# Patient Record
Sex: Female | Born: 1945 | Race: White | Hispanic: No | Marital: Single
Health system: Southern US, Community
[De-identification: ages and names within clinical notes are randomized; demographics above are authoritative.]

## PROBLEM LIST (undated history)

## (undated) DIAGNOSIS — I10 Essential (primary) hypertension: Secondary | ICD-10-CM

## (undated) DIAGNOSIS — F0390 Unspecified dementia without behavioral disturbance: Secondary | ICD-10-CM

---

## 2020-11-24 ENCOUNTER — Other Ambulatory Visit: Payer: Self-pay

## 2020-11-24 ENCOUNTER — Emergency Department: Payer: Medicare Other

## 2020-11-24 ENCOUNTER — Inpatient Hospital Stay
Admission: EM | Admit: 2020-11-24 | Discharge: 2020-11-29 | DRG: 640 | Disposition: A | Discharge status: Home Health | Payer: Medicare Other | Attending: Internal Medicine | Admitting: Internal Medicine

## 2020-11-24 DIAGNOSIS — I959 Hypotension, unspecified: Secondary | ICD-10-CM | POA: Diagnosis present

## 2020-11-24 DIAGNOSIS — E876 Hypokalemia: Secondary | ICD-10-CM | POA: Diagnosis present

## 2020-11-24 DIAGNOSIS — I493 Ventricular premature depolarization: Secondary | ICD-10-CM | POA: Diagnosis present

## 2020-11-24 DIAGNOSIS — S8011XA Contusion of right lower leg, initial encounter: Secondary | ICD-10-CM | POA: Diagnosis present

## 2020-11-24 DIAGNOSIS — S20229A Contusion of unspecified back wall of thorax, initial encounter: Secondary | ICD-10-CM | POA: Diagnosis present

## 2020-11-24 DIAGNOSIS — S7012XA Contusion of left thigh, initial encounter: Secondary | ICD-10-CM | POA: Diagnosis present

## 2020-11-24 DIAGNOSIS — T441X6A Underdosing of other parasympathomimetics, initial encounter: Secondary | ICD-10-CM | POA: Diagnosis present

## 2020-11-24 DIAGNOSIS — W19XXXA Unspecified fall, initial encounter: Secondary | ICD-10-CM | POA: Diagnosis present

## 2020-11-24 DIAGNOSIS — R269 Unspecified abnormalities of gait and mobility: Secondary | ICD-10-CM | POA: Diagnosis present

## 2020-11-24 DIAGNOSIS — G9341 Metabolic encephalopathy: Secondary | ICD-10-CM | POA: Diagnosis present

## 2020-11-24 DIAGNOSIS — F0281 Dementia in other diseases classified elsewhere with behavioral disturbance: Secondary | ICD-10-CM | POA: Diagnosis present

## 2020-11-24 DIAGNOSIS — R296 Repeated falls: Secondary | ICD-10-CM | POA: Diagnosis present

## 2020-11-24 DIAGNOSIS — Z20822 Contact with and (suspected) exposure to covid-19: Secondary | ICD-10-CM | POA: Diagnosis present

## 2020-11-24 DIAGNOSIS — R4182 Altered mental status, unspecified: Secondary | ICD-10-CM | POA: Diagnosis present

## 2020-11-24 DIAGNOSIS — Y92019 Unspecified place in single-family (private) house as the place of occurrence of the external cause: Secondary | ICD-10-CM

## 2020-11-24 DIAGNOSIS — E860 Dehydration: Secondary | ICD-10-CM | POA: Diagnosis present

## 2020-11-24 DIAGNOSIS — G309 Alzheimer's disease, unspecified: Secondary | ICD-10-CM | POA: Diagnosis present

## 2020-11-24 DIAGNOSIS — R5381 Other malaise: Secondary | ICD-10-CM | POA: Diagnosis present

## 2020-11-24 DIAGNOSIS — F03918 Unspecified dementia, unspecified severity, with other behavioral disturbance: Secondary | ICD-10-CM | POA: Diagnosis present

## 2020-11-24 DIAGNOSIS — S5002XA Contusion of left elbow, initial encounter: Secondary | ICD-10-CM | POA: Diagnosis present

## 2020-11-24 DIAGNOSIS — E871 Hypo-osmolality and hyponatremia: Principal | ICD-10-CM | POA: Diagnosis present

## 2020-11-24 DIAGNOSIS — Z91128 Patient's intentional underdosing of medication regimen for other reason: Secondary | ICD-10-CM

## 2020-11-24 DIAGNOSIS — F0391 Unspecified dementia with behavioral disturbance: Secondary | ICD-10-CM | POA: Diagnosis present

## 2020-11-24 DIAGNOSIS — I10 Essential (primary) hypertension: Secondary | ICD-10-CM | POA: Diagnosis present

## 2020-11-24 DIAGNOSIS — N3000 Acute cystitis without hematuria: Secondary | ICD-10-CM

## 2020-11-24 DIAGNOSIS — M25552 Pain in left hip: Secondary | ICD-10-CM

## 2020-11-24 LAB — RESP PANEL BY RT-PCR (FLU A&B, COVID) ARPGX2
Influenza A by PCR: NEGATIVE
Influenza B by PCR: NEGATIVE
SARS Coronavirus 2 by RT PCR: NEGATIVE

## 2020-11-24 LAB — CBC
HCT: 38.9 % (ref 36.0–46.0)
Hemoglobin: 14.2 g/dL (ref 12.0–15.0)
MCH: 34.2 pg — ABNORMAL HIGH (ref 26.0–34.0)
MCHC: 36.5 g/dL — ABNORMAL HIGH (ref 30.0–36.0)
MCV: 93.7 fL (ref 80.0–100.0)
Platelets: 340 10*3/uL (ref 150–400)
RBC: 4.15 MIL/uL (ref 3.87–5.11)
RDW: 12.8 % (ref 11.5–15.5)
WBC: 10.4 10*3/uL (ref 4.0–10.5)
nRBC: 0 % (ref 0.0–0.2)

## 2020-11-24 LAB — URINALYSIS, COMPLETE (UACMP) WITH MICROSCOPIC
Bilirubin Urine: NEGATIVE
Glucose, UA: NEGATIVE mg/dL
Hgb urine dipstick: NEGATIVE
Ketones, ur: 5 mg/dL — AB
Nitrite: NEGATIVE
Protein, ur: NEGATIVE mg/dL
Specific Gravity, Urine: 1.01 (ref 1.005–1.030)
pH: 6 (ref 5.0–8.0)

## 2020-11-24 LAB — BASIC METABOLIC PANEL
Anion gap: 7 (ref 5–15)
BUN: 9 mg/dL (ref 8–23)
CO2: 27 mmol/L (ref 22–32)
Calcium: 8.8 mg/dL — ABNORMAL LOW (ref 8.9–10.3)
Chloride: 94 mmol/L — ABNORMAL LOW (ref 98–111)
Creatinine, Ser: 0.61 mg/dL (ref 0.44–1.00)
GFR, Estimated: >60 mL/min (ref >60)
Glucose, Bld: 137 mg/dL — ABNORMAL HIGH (ref 70–99)
Potassium: 4 mmol/L (ref 3.5–5.1)
Sodium: 128 mmol/L — ABNORMAL LOW (ref 135–145)

## 2020-11-24 MED ORDER — SODIUM CHLORIDE 0.9 % IV SOLN: INTRAVENOUS | Status: DC

## 2020-11-24 MED ORDER — MORPHINE SULFATE (PF) 4 MG/ML IV SOLN: 4.0000 mg | Freq: Once | INTRAVENOUS | Status: DC

## 2020-11-24 MED ORDER — MORPHINE SULFATE (PF) 4 MG/ML IV SOLN
4.0000 mg | Freq: Once | INTRAVENOUS | Status: AC
  Administered 2020-11-24: 4 mg via INTRAMUSCULAR
  Filled 2020-11-24: qty 1

## 2020-11-24 MED ORDER — SODIUM CHLORIDE 0.9 % IV BOLUS
1000.0000 mL | Freq: Once | INTRAVENOUS | Status: AC
  Administered 2020-11-24: 1000 mL via INTRAVENOUS

## 2020-11-24 MED ORDER — HALOPERIDOL LACTATE 5 MG/ML IJ SOLN
2.0000 mg | Freq: Four times a day (QID) | INTRAMUSCULAR | Status: DC | PRN
  Administered 2020-11-26: 22:00:00 2 mg via INTRAVENOUS
  Filled 2020-11-24: qty 1

## 2020-11-24 MED ORDER — DONEPEZIL HCL 5 MG PO TABS
5.0000 mg | ORAL_TABLET | Freq: Every day | ORAL | Status: DC
  Filled 2020-11-24: qty 1

## 2020-11-24 MED ORDER — ACETAMINOPHEN 650 MG RE SUPP: 650.0000 mg | Freq: Four times a day (QID) | RECTAL | Status: DC | PRN

## 2020-11-24 MED ORDER — ENOXAPARIN SODIUM 40 MG/0.4ML IJ SOSY
40.0000 mg | PREFILLED_SYRINGE | INTRAMUSCULAR | Status: DC
  Administered 2020-11-25 – 2020-11-28 (×4): 40 mg via SUBCUTANEOUS
  Filled 2020-11-24 (×4): qty 0.4

## 2020-11-24 MED ORDER — SODIUM CHLORIDE 0.9 % IV SOLN
1.0000 g | Freq: Once | INTRAVENOUS | Status: AC
  Administered 2020-11-24: 1 g via INTRAVENOUS
  Filled 2020-11-24: qty 10

## 2020-11-24 MED ORDER — ONDANSETRON HCL 4 MG/2ML IJ SOLN: 4.0000 mg | Freq: Four times a day (QID) | INTRAMUSCULAR | Status: DC | PRN

## 2020-11-24 MED ORDER — AMLODIPINE BESYLATE 5 MG PO TABS
5.0000 mg | ORAL_TABLET | Freq: Every day | ORAL | Status: DC
  Administered 2020-11-24 – 2020-11-27 (×4): 5 mg via ORAL
  Filled 2020-11-24 (×6): qty 1

## 2020-11-24 MED ORDER — ACETAMINOPHEN 325 MG PO TABS
650.0000 mg | ORAL_TABLET | Freq: Four times a day (QID) | ORAL | Status: DC | PRN
  Administered 2020-11-25 – 2020-11-27 (×2): 650 mg via ORAL
  Filled 2020-11-24 (×3): qty 2

## 2020-11-24 MED ORDER — ONDANSETRON HCL 4 MG PO TABS: 4.0000 mg | ORAL_TABLET | Freq: Four times a day (QID) | ORAL | Status: DC | PRN

## 2020-11-24 NOTE — ED Notes (Signed)
Tele sitter set up at foot of pt's bed.

## 2020-11-24 NOTE — ED Notes (Signed)
Patient had removed purewick and her gown.  She is wet from urine now.  I cleaned her and changed her bed. Placed new purewick.  Warm blankets.  Her bed alarm is on and telesitter is in room.  She is resting with eyes closed now.

## 2020-11-24 NOTE — H&P (Addendum)
History and Physical    Kristina Watkins BCW:[id number] DOB: 06/03/1945 DOA: 11/24/2020  PCP: Pcp, No   Patient coming from: Home  I have personally briefly reviewed patient's old medical records in [facility]  Chief Complaint: Weakness/fall Most of the history was obtained from the ER note as patient is unable to provide any history due to her underlying dementia.  Attempted to contact patient's spouse and awaiting a callback.  HPI: Kristina Watkins is a 75 y.o. female with medical history significant for Alzheimer's dementia who was brought into the ER for evaluation by her husband and primary caregiver for evaluation of multiple falls over the last week.  Patient's husband stated that she has become progressively weak and has poor oral intake.  He also states that she has been more confused in the last week and he had an does not know of any specific aggravating or relieving factors. Per ER notes patient complained of pain in her left hip and buttock.  She has bruises on her back, left thigh, both lower extremities and left elbow. I am unable to do review of systems on this patient due to her mental status. Labs show sodium 128, potassium 4.0,, bicarb 27, glucose 137, BUN 9, creatinine 0.61, calcium 8.8, white count 10.4, hemoglobin 14.2, hematocrit 38.9, MCV 93.7, RDW 12.8, platelet count 370 Respiratory viral panel is pending X-ray of left hip does not show any evidence of a displaced hip fracture. CT scan of the head without contrast shows no acute intracranial pathology.Moderate parenchymal volume loss with disproportionate hippocampal atrophy and moderate chronic white matter  microangiopathy. Twelve-lead EKG shows sinus rhythm with PVCs.    ED Course: Patient is a 75 year old female who presents to the ER with her husband for evaluation of weakness and multiple falls at home.  She also complains of pain in her left hip and left buttock and is found to have bruises related to her  multiple falls. Patient is noted to be very confused and agitated, difficult to reorient which her husband states is new for her. Labs show sodium level of 128. She will be referred to observation status for further evaluation  Review of Systems: As per HPI otherwise all other systems reviewed and negative.    History reviewed. No pertinent past medical history. Unable to obtain history due to patient's dementia  History reviewed. No pertinent surgical history. Unable to obtain history due to patient's dementia   has no history on file for tobacco use, alcohol use, and drug use.  Allergies  Allergen Reactions   Penicillins     No family history on file.  Unable to obtain history due to patient's dementia   Prior to Admission medications   Medication Sig Start Date End Date Taking? Authorizing Provider  donepezil (ARICEPT) 5 MG tablet Take 5 mg by mouth at bedtime.   Yes [provider]    Physical Exam: Vitals:   11/24/20 1223 11/24/20 1230 11/24/20 1256 11/24/20 1539  BP: (!) 160/91 (!) 165/91 (!) 168/92 (!) 141/76  Pulse: (!) 59 85 74 63  Resp: 16 17 18 14   Temp:    98.6 F (37 C)  TempSrc:    Oral  SpO2: 97% 96% 100% 94%     Vitals:   11/24/20 1223 11/24/20 1230 11/24/20 1256 11/24/20 1539  BP: (!) 160/91 (!) 165/91 (!) 168/92 (!) 141/76  Pulse: (!) 59 85 74 63  Resp: 16 17 18 14   Temp:    98.6 F (37  C)  TempSrc:    Oral  SpO2: 97% 96% 100% 94%      Constitutional: Alert and oriented x 1.  Only to person not to place or time. Not in any apparent distress.  Very restless and agitated HEENT:      Head: Normocephalic and atraumatic.         Eyes: PERLA, EOMI, Conjunctivae are normal. Sclera is non-icteric.       Mouth/Throat: Mucous membranes are moist.       Neck: Supple with no signs of meningismus. Cardiovascular: Regular rate and rhythm. No murmurs, gallops, or rubs. 2+ symmetrical distal pulses are present . No JVD. No LE  edema Respiratory: Respiratory effort normal .Lungs sounds clear bilaterally. No wheezes, crackles, or rhonchi.  Gastrointestinal: Soft, non tender, and non distended with positive bowel sounds.  Genitourinary: No CVA tenderness. Musculoskeletal: Nontender with normal range of motion in all extremities. No cyanosis, or erythema of extremities. Neurologic:  Face is symmetric. Moving all extremities. No gross focal neurologic deficits . Skin: Skin is warm, dry.  Ecchymosis over left thigh, both forearms and left elbow Psychiatric: Mood and affect are normal    Labs on Admission: I have personally reviewed following labs and imaging studies  CBC: Recent Labs  Lab 11/24/20 0938  WBC 10.4  HGB 14.2  HCT 38.9  MCV 93.7  PLT 340   Basic Metabolic Panel: Recent Labs  Lab 11/24/20 0938  NA 128*  K 4.0  CL 94*  CO2 27  GLUCOSE 137*  BUN 9  CREATININE 0.61  CALCIUM 8.8*   GFR: CrCl cannot be calculated (Unknown ideal weight.). Liver Function Tests: No results for input(s): AST, ALT, ALKPHOS, BILITOT, PROT, ALBUMIN in the last 168 hours. No results for input(s): LIPASE, AMYLASE in the last 168 hours. No results for input(s): AMMONIA in the last 168 hours. Coagulation Profile: No results for input(s): INR, PROTIME in the last 168 hours. Cardiac Enzymes: No results for input(s): CKTOTAL, CKMB, CKMBINDEX, TROPONINI in the last 168 hours. BNP (last 3 results) No results for input(s): PROBNP in the last 8760 hours. HbA1C: No results for input(s): HGBA1C in the last 72 hours. CBG: No results for input(s): GLUCAP in the last 168 hours. Lipid Profile: No results for input(s): CHOL, HDL, LDLCALC, TRIG, CHOLHDL, LDLDIRECT in the last 72 hours. Thyroid Function Tests: No results for input(s): TSH, T4TOTAL, FREET4, T3FREE, THYROIDAB in the last 72 hours. Anemia Panel: No results for input(s): VITAMINB12, FOLATE, FERRITIN, TIBC, IRON, RETICCTPCT in the last 72 hours. Urine analysis:     Component Value Date/Time   COLORURINE YELLOW (A) 11/24/2020 1559   APPEARANCEUR CLEAR (A) 11/24/2020 1559   LABSPEC 1.010 11/24/2020 1559   PHURINE 6.0 11/24/2020 1559   GLUCOSEU NEGATIVE 11/24/2020 1559   HGBUR NEGATIVE 11/24/2020 1559   BILIRUBINUR NEGATIVE 11/24/2020 1559   KETONESUR 5 (A) 11/24/2020 1559   PROTEINUR NEGATIVE 11/24/2020 1559   NITRITE NEGATIVE 11/24/2020 1559   LEUKOCYTESUR LARGE (A) 11/24/2020 1559    Radiological Exams on Admission: CT Head Wo Contrast  Result Date: 11/24/2020 CLINICAL DATA:  Fall EXAM: CT HEAD WITHOUT CONTRAST TECHNIQUE: Contiguous axial images were obtained from the base of the skull through the vertex without intravenous contrast. COMPARISON:  None. FINDINGS: Brain: There is no evidence of acute intracranial hemorrhage, extra-axial fluid collection, or acute infarct. There is moderate parenchymal volume loss with disproportionate hippocampal atrophy. Foci of hypodensity in the subcortical and periventricular white matter likely reflect sequela of moderate  chronic white matter microangiopathy. There is no mass lesion. There is no midline shift. Vascular: No hyperdense vessel or unexpected calcification. Skull: Normal. Negative for fracture or focal lesion. Sinuses/Orbits: The imaged paranasal sinuses are clear. The globes and orbits are unremarkable. Other: None. IMPRESSION: 1. No acute intracranial pathology. 2. Moderate parenchymal volume loss with disproportionate hippocampal atrophy and moderate chronic white matter microangiopathy. Electronically Signed   By: Lesia Hausen M.D.   On: 11/24/2020 14:05   DG Hip Unilat With Pelvis 2-3 Views Left  Result Date: 11/24/2020 CLINICAL DATA:  Left hip pain after fall EXAM: DG HIP (WITH OR WITHOUT PELVIS) 2-3V LEFT COMPARISON:  None. FINDINGS: There is no evidence of hip fracture or dislocation. Sacrum and portions of the bilateral iliac bones can not be evaluated due to overlying bowel gas. There is no  evidence of arthropathy or other focal bone abnormality. IMPRESSION: No evidence of displaced hip fracture. Electronically Signed   By: Allegra Lai M.D.   On: 11/24/2020 14:03     Assessment/Plan Principal Problem:   Acute metabolic encephalopathy Active Problems:   AMS (altered mental status)   Dementia with behavioral disturbance (HCC)   Essential hypertension   Hypotension     Acute metabolic encephalopathy Most likely delirium of unclear etiology Awaiting results of urine analysis Patient also has mild hyponatremia Supportive care She will need safety precautions to reduce her risk for falls    Dementia Continue Aricept    Hypertension Start patient on low-dose of amlodipine    Hyponatremia Most likely secondary to poor oral intake IV fluid hydration Repeat electrolytes in a.m.   DVT prophylaxis: Lovenox  Code Status: full code  Family Communication: Attempted to call patient's husband on the phone, left voicemail awaiting callback. Disposition Plan: Back to previous home environment Consults called: none  Status: Observation status:    Lucile Shutters MD [facility]     11/24/2020, 4:35 PM

## 2020-11-24 NOTE — ED Notes (Signed)
Stretcher locked in low position with side rails up x2, call light in reach. High fall precautions in place including use of bed alarm.

## 2020-11-24 NOTE — ED Triage Notes (Signed)
Pt comes into the ED via [facility] from home c/o mechanical fall.  Pt has had several falls the past couple days due to increased lethargy.  Pt has h/o dementia.  Pt c/o low back pain, and patient does have bruises present on the back, legs, and left elbow.    70Hr 98.2 oral 97% 152/82 CBg 140

## 2020-11-24 NOTE — ED Notes (Signed)
RN to bedside due to bed alarm sounding. Pt found undressed in ED stretcher, attempting to get out of bed. Unable to reorient patient to situation or place, assisted pt back into bed, redressed. High fall precautions continued including bed alarm, stretcher locked in low position with side rails up x2.

## 2020-11-24 NOTE — ED Notes (Addendum)
NP Manuela Schwartz messaged for family's request for update and test results

## 2020-11-24 NOTE — ED Notes (Signed)
Again, this RN notified charge RN that pt is in need of sitter. Pt found undressed in ED stretcher, redressed - high fall precautions remain in place including bed alarm, stretcher locked in low position with side rails up x2. Unsuccessful attempt to reorient patient to place and situation.   Attempted to call husband again, no answer.

## 2020-11-24 NOTE — ED Notes (Signed)
Pt found undressed in ED stretcher, self removed IV. Gauze bandage applied by nursing staff. Dr Vicente Males aware. Other nursing staff attempted to contact pt's husband who was present with pt when she first presented to ED, no answer. Pt's daughter states she does not live nearby and is not available to sit with the pt. Charge Radiation protection practitioner is needed for pt safety.   High fall precautions continued, bed alarm in use. Stretcher locked in low position with side rails up x2, call light in reach.

## 2020-11-24 NOTE — ED Notes (Signed)
Attempted to contact husband, no answer.

## 2020-11-24 NOTE — ED Triage Notes (Signed)
Husband reports pt has not hit head with recent falls  Pt hx dementia Not answering questions appropriately

## 2020-11-24 NOTE — ED Notes (Signed)
Husband at bedside. Pt enjoying coffee.

## 2020-11-24 NOTE — ED Provider Notes (Signed)
[facility] Emergency Department Provider Note   ____________________________________________   Event Date/Time   First MD Initiated Contact with Patient 11/24/20 1149     (approximate)  I have reviewed the triage vital signs and the nursing notes.   HISTORY  Chief Complaint Fall    HPI Kristina Watkins is a 75 y.o. female with a past medical history of Alzheimer's disease who presents with her husband who is her primary caregiver with complaints of multiple falls over the past week.  Husband states that patient has been having worsening weakness, decreased p.o. intake, increased falls, and increased confusion over this last week without any specific exacerbating or relieving factors.  Notes patient has been complaining of severe left-sided hip and buttock pain after recent fall that prompted their visit to our emergency department.  Patient only complains of left hip and buttock pain at this time.         History reviewed. No pertinent past medical history.  Patient Active Problem List   Diagnosis Date Noted   AMS (altered mental status) 11/24/2020   Dementia with behavioral disturbance (HCC) 11/24/2020   Acute metabolic encephalopathy 11/24/2020   Essential hypertension 11/24/2020    History reviewed. No pertinent surgical history.  Prior to Admission medications   Medication Sig Start Date End Date Taking? Authorizing Provider  donepezil (ARICEPT) 5 MG tablet Take 5 mg by mouth at bedtime. Patient not taking: Reported on 11/24/2020    [provider]    Allergies Penicillins  No family history on file.  Social History    Review of Systems Unable to assess ____________________________________________   PHYSICAL EXAM:  VITAL SIGNS: ED Triage Vitals [11/24/20 0934]  Enc Vitals Group     BP (!) 137/99     Pulse Rate 65     Resp 20     Temp 98.2 F (36.8 C)     Temp Source Oral     SpO2 97 %     Weight      Height       Head Circumference      Peak Flow      Pain Score      Pain Loc      Pain Edu?      Excl. in GC?    Constitutional: Alert and disoriented. Well appearing elderly Caucasian female in no acute distress. Eyes: Conjunctivae are normal. PERRL. Head: Atraumatic. Nose: No congestion/rhinnorhea. Mouth/Throat: Mucous membranes are moist. Neck: No stridor Cardiovascular: Grossly normal heart sounds.  Good peripheral circulation. Respiratory: Normal respiratory effort.  No retractions. Gastrointestinal: Soft and nontender. No distention. Musculoskeletal: No obvious deformities Neurologic: Oriented only to self.  Sensation intact in all extremities however patient is unable to ambulate without assistance Skin:  Skin is warm and dry. No rash noted. Psychiatric: Cooperative  ____________________________________________   LABS (all labs ordered are listed, but only abnormal results are displayed)  Labs Reviewed  BASIC METABOLIC PANEL - Abnormal; Notable for the following components:      Result Value   Sodium 128 (*)    Chloride 94 (*)    Glucose, Bld 137 (*)    Calcium 8.8 (*)    All other components within normal limits  CBC - Abnormal; Notable for the following components:   MCH 34.2 (*)    MCHC 36.5 (*)    All other components within normal limits  URINALYSIS, COMPLETE (UACMP) WITH MICROSCOPIC - Abnormal; Notable for the following components:   Color, Urine  YELLOW (*)    APPearance CLEAR (*)    Ketones, ur 5 (*)    Leukocytes,Ua LARGE (*)    Bacteria, UA RARE (*)    All other components within normal limits  RESP PANEL BY RT-PCR (FLU A&B, COVID) [id number]  URINE CULTURE  URINALYSIS, ROUTINE W REFLEX MICROSCOPIC   ____________________________________________  EKG  ED ECG REPORT I, Merwyn Katos, the attending physician, personally viewed and interpreted this ECG.  Date: 11/24/2020 EKG Time: 0942 Rate: 67 Rhythm: normal sinus rhythm QRS Axis: normal Intervals:  normal ST/T Wave abnormalities: normal Narrative Interpretation: no evidence of acute ischemia  ____________________________________________  RADIOLOGY  ED MD interpretation: CT of the head without contrast shows no evidence of acute abnormalities including no intracerebral hemorrhage, obvious masses, or significant edema  3 view x-ray of the left hip does not show any evidence of acute fractures or dislocations  Official radiology report(s): CT Head Wo Contrast  Result Date: 11/24/2020 CLINICAL DATA:  Fall EXAM: CT HEAD WITHOUT CONTRAST TECHNIQUE: Contiguous axial images were obtained from the base of the skull through the vertex without intravenous contrast. COMPARISON:  None. FINDINGS: Brain: There is no evidence of acute intracranial hemorrhage, extra-axial fluid collection, or acute infarct. There is moderate parenchymal volume loss with disproportionate hippocampal atrophy. Foci of hypodensity in the subcortical and periventricular white matter likely reflect sequela of moderate chronic white matter microangiopathy. There is no mass lesion. There is no midline shift. Vascular: No hyperdense vessel or unexpected calcification. Skull: Normal. Negative for fracture or focal lesion. Sinuses/Orbits: The imaged paranasal sinuses are clear. The globes and orbits are unremarkable. Other: None. IMPRESSION: 1. No acute intracranial pathology. 2. Moderate parenchymal volume loss with disproportionate hippocampal atrophy and moderate chronic white matter microangiopathy. Electronically Signed   By: Lesia Hausen M.D.   On: 11/24/2020 14:05   DG Hip Unilat With Pelvis 2-3 Views Left  Result Date: 11/24/2020 CLINICAL DATA:  Left hip pain after fall EXAM: DG HIP (WITH OR WITHOUT PELVIS) 2-3V LEFT COMPARISON:  None. FINDINGS: There is no evidence of hip fracture or dislocation. Sacrum and portions of the bilateral iliac bones can not be evaluated due to overlying bowel gas. There is no evidence of  arthropathy or other focal bone abnormality. IMPRESSION: No evidence of displaced hip fracture. Electronically Signed   By: Allegra Lai M.D.   On: 11/24/2020 14:03    ____________________________________________   PROCEDURES  Procedure(s) performed (including Critical Care):  .1-3 Lead EKG Interpretation Performed by: Merwyn Katos, MD Authorized by: Merwyn Katos, MD     Interpretation: normal     ECG rate:  62   ECG rate assessment: normal     Rhythm: sinus rhythm     Ectopy: none     Conduction: normal     ____________________________________________   INITIAL IMPRESSION / ASSESSMENT AND PLAN / ED COURSE  As part of my medical decision making, I reviewed the following data within the electronic medical record, if available:  Nursing notes reviewed and incorporated, Labs reviewed, EKG interpreted, Old chart reviewed, Radiograph reviewed and Notes from prior ED visits reviewed and incorporated     Patient presents for altered mental status and ambulatory dysfunction of unknown origin  Will obtain medical workup and discuss with social work to try to obtain collateral information.  Given History, Physical, and Workup there is no overt concern for a dangerous emergent cause such as, but not limited to, CNS infection, severe Toxidrome, severe metabolic derangement, or  stroke. Patient does have evidence of hyponatremia and urinary tract infection which may account for her persistent altered mental status and decline in her ability to perform activities of daily living Disposition: Admit; the patient is suffering altered mental status that is persistent and therefore they will be admitted.      ____________________________________________   FINAL CLINICAL IMPRESSION(S) / ED DIAGNOSES  Final diagnoses:  Fall, initial encounter  Acute hip pain, left  Acute cystitis without hematuria  Hyponatremia     ED Discharge Orders     None        Note:  This  document was prepared using Dragon voice recognition software and may include unintentional dictation errors.    Merwyn Katos, MD 11/24/20 [phone]

## 2020-11-25 DIAGNOSIS — G9341 Metabolic encephalopathy: Secondary | ICD-10-CM | POA: Diagnosis present

## 2020-11-25 DIAGNOSIS — R5381 Other malaise: Secondary | ICD-10-CM | POA: Diagnosis present

## 2020-11-25 DIAGNOSIS — S5002XA Contusion of left elbow, initial encounter: Secondary | ICD-10-CM | POA: Diagnosis present

## 2020-11-25 DIAGNOSIS — E876 Hypokalemia: Secondary | ICD-10-CM | POA: Diagnosis present

## 2020-11-25 DIAGNOSIS — T441X6A Underdosing of other parasympathomimetics, initial encounter: Secondary | ICD-10-CM | POA: Diagnosis present

## 2020-11-25 DIAGNOSIS — G309 Alzheimer's disease, unspecified: Secondary | ICD-10-CM | POA: Diagnosis present

## 2020-11-25 DIAGNOSIS — Y92019 Unspecified place in single-family (private) house as the place of occurrence of the external cause: Secondary | ICD-10-CM | POA: Diagnosis not present

## 2020-11-25 DIAGNOSIS — I10 Essential (primary) hypertension: Secondary | ICD-10-CM | POA: Diagnosis present

## 2020-11-25 DIAGNOSIS — R296 Repeated falls: Secondary | ICD-10-CM | POA: Diagnosis present

## 2020-11-25 DIAGNOSIS — Z20822 Contact with and (suspected) exposure to covid-19: Secondary | ICD-10-CM | POA: Diagnosis present

## 2020-11-25 DIAGNOSIS — W19XXXA Unspecified fall, initial encounter: Secondary | ICD-10-CM | POA: Diagnosis present

## 2020-11-25 DIAGNOSIS — G301 Alzheimer's disease with late onset: Secondary | ICD-10-CM | POA: Diagnosis not present

## 2020-11-25 DIAGNOSIS — E860 Dehydration: Secondary | ICD-10-CM | POA: Diagnosis present

## 2020-11-25 DIAGNOSIS — S8011XA Contusion of right lower leg, initial encounter: Secondary | ICD-10-CM | POA: Diagnosis present

## 2020-11-25 DIAGNOSIS — I959 Hypotension, unspecified: Secondary | ICD-10-CM | POA: Diagnosis present

## 2020-11-25 DIAGNOSIS — Z91128 Patient's intentional underdosing of medication regimen for other reason: Secondary | ICD-10-CM | POA: Diagnosis not present

## 2020-11-25 DIAGNOSIS — S20229A Contusion of unspecified back wall of thorax, initial encounter: Secondary | ICD-10-CM | POA: Diagnosis present

## 2020-11-25 DIAGNOSIS — S7012XA Contusion of left thigh, initial encounter: Secondary | ICD-10-CM | POA: Diagnosis present

## 2020-11-25 DIAGNOSIS — E871 Hypo-osmolality and hyponatremia: Principal | ICD-10-CM | POA: Diagnosis present

## 2020-11-25 DIAGNOSIS — R269 Unspecified abnormalities of gait and mobility: Secondary | ICD-10-CM | POA: Diagnosis present

## 2020-11-25 DIAGNOSIS — I493 Ventricular premature depolarization: Secondary | ICD-10-CM | POA: Diagnosis present

## 2020-11-25 DIAGNOSIS — F0281 Dementia in other diseases classified elsewhere with behavioral disturbance: Secondary | ICD-10-CM | POA: Diagnosis present

## 2020-11-25 LAB — BASIC METABOLIC PANEL
Anion gap: 8 (ref 5–15)
BUN: 7 mg/dL — ABNORMAL LOW (ref 8–23)
CO2: 26 mmol/L (ref 22–32)
Calcium: 8.9 mg/dL (ref 8.9–10.3)
Chloride: 97 mmol/L — ABNORMAL LOW (ref 98–111)
Creatinine, Ser: 0.46 mg/dL (ref 0.44–1.00)
GFR, Estimated: >60 mL/min (ref >60)
Glucose, Bld: 148 mg/dL — ABNORMAL HIGH (ref 70–99)
Potassium: 3.2 mmol/L — ABNORMAL LOW (ref 3.5–5.1)
Sodium: 131 mmol/L — ABNORMAL LOW (ref 135–145)

## 2020-11-25 LAB — MAGNESIUM: Magnesium: 1.8 mg/dL (ref 1.7–2.4)

## 2020-11-25 LAB — PHOSPHORUS: Phosphorus: 2.4 mg/dL — ABNORMAL LOW (ref 2.5–4.6)

## 2020-11-25 MED ORDER — POTASSIUM CHLORIDE IN NACL 40-0.9 MEQ/L-% IV SOLN
INTRAVENOUS | Status: AC
  Filled 2020-11-25 (×3): qty 1000

## 2020-11-25 NOTE — Progress Notes (Addendum)
Progress Note    Kristina Watkins  WLN:[id number] DOB: [birth year]  DOA: 11/24/2020 PCP: Pcp, No      Brief Narrative:    Medical records reviewed and are as summarized below:  Kristina Watkins is a 75 y.o. female with past medical history significant for advanced dementia who was brought to the hospital because of multiple falls at home.  According to husband, patient has become progressively weaker and has not been eating well.  He said he gave him donepezil but he noticed that patient had become more "delusional" so he stopped it after 3 days.      Assessment/Plan:   Principal Problem:   Acute metabolic encephalopathy Active Problems:   AMS (altered mental status)   Dementia with behavioral disturbance (HCC)   Essential hypertension   Hyponatremia    Body mass index is 20.73 kg/m.   Hyponatremia: Continue IV fluids for hydration.  Monitor BMP.  Acute metabolic encephalopathy superimposed on underlying dementia: This may be due to dehydration/hyponatremia.  Continue supportive care.  Hypokalemia: Replete potassium and monitor levels  Mild hypophosphatemia: With this will improve with adequate oral intake.  Monitor phosphorus level.  Hypertension: Continue amlodipine  Multiple falls at home/debility: Consult PT and OT.  Plan discussed with her husband at the bedside.  He requested that I start patient on Viagra because he understands that Viagra improves cognition in patients with dementia.  However, I explained politely that I am unable to start patient on Viagra for dementia since Viagra is not FDA approved for dementia.    Diet Order             Diet Heart Room service appropriate? Yes; Fluid consistency: Thin  Diet effective now                      Consultants: None  Procedures: None    Medications:    amLODipine  5 mg Oral Daily   enoxaparin (LOVENOX) injection  40 mg Subcutaneous Q24H   Continuous Infusions:  0.9 % NaCl with  KCl 40 mEq / L       Anti-infectives (From admission, onward)    Start     Dose/Rate Route Frequency Ordered Stop   11/24/20 1715  cefTRIAXone (ROCEPHIN) 1 g in sodium chloride 0.9 % 100 mL IVPB        1 g 200 mL/hr over 30 Minutes Intravenous  Once 11/24/20 1703 11/24/20 1839              Family Communication/Anticipated D/C date and plan/Code Status   DVT prophylaxis: enoxaparin (LOVENOX) injection 40 mg Start: 11/24/20 1645     Code Status: Full Code  Family Communication: Husband at the bedside Disposition Plan:    Status is: Observation  The patient will require care spanning > 2 midnights and should be moved to inpatient because: IV treatments appropriate due to intensity of illness or inability to take PO and Inpatient level of care appropriate due to severity of illness  Dispo: The patient is from: Home              Anticipated d/c is to: SNF              Patient currently is not medically stable to d/c.   Difficult to place patient No           Subjective:   Interval events noted.  Patient is confused and unable to provide any history.  Her husband  was at the bedside.  Her nurse was also present at the bedside.  Objective:    Vitals:   11/25/20 0036 11/25/20 0541 11/25/20 0844 11/25/20 1138  BP: (!) 153/94 137/73 125/87 (!) 155/87  Pulse: 77 85 82 88  Resp: 20 16 16 12   Temp: 98.3 F (36.8 C) 98.1 F (36.7 C) 97.9 F (36.6 C) 97.6 F (36.4 C)  TempSrc: Oral Oral  Axillary  SpO2: 95% 95% 96% 100%  Weight:      Height:       No data found.   Intake/Output Summary (Last 24 hours) at 11/25/2020 1359 Last data filed at 11/25/2020 1020 Gross per 24 hour  Intake 440 ml  Output --  Net 440 ml   Filed Weights   11/24/20 2156  Weight: 51.4 kg    Exam:  GEN: NAD SKIN: No rash EYES: EOMI ENT: MMM CV: RRR PULM: CTA B ABD: soft, ND, NT, +BS CNS: AAO x 1 (person), non focal EXT: No edema or tenderness        Data  Reviewed:   I have personally reviewed following labs and imaging studies:  Labs: Labs show the following:   Basic Metabolic Panel: Recent Labs  Lab 11/24/20 0938 11/25/20 0924  NA 128* 131*  K 4.0 3.2*  CL 94* 97*  CO2 27 26  GLUCOSE 137* 148*  BUN 9 7*  CREATININE 0.61 0.46  CALCIUM 8.8* 8.9  MG  --  1.8  PHOS  --  2.4*   GFR Estimated Creatinine Clearance: 46.6 mL/min (by C-G formula based on SCr of 0.46 mg/dL). Liver Function Tests: No results for input(s): AST, ALT, ALKPHOS, BILITOT, PROT, ALBUMIN in the last 168 hours. No results for input(s): LIPASE, AMYLASE in the last 168 hours. No results for input(s): AMMONIA in the last 168 hours. Coagulation profile No results for input(s): INR, PROTIME in the last 168 hours.  CBC: Recent Labs  Lab 11/24/20 0938  WBC 10.4  HGB 14.2  HCT 38.9  MCV 93.7  PLT 340   Cardiac Enzymes: No results for input(s): CKTOTAL, CKMB, CKMBINDEX, TROPONINI in the last 168 hours. BNP (last 3 results) No results for input(s): PROBNP in the last 8760 hours. CBG: No results for input(s): GLUCAP in the last 168 hours. D-Dimer: No results for input(s): DDIMER in the last 72 hours. Hgb A1c: No results for input(s): HGBA1C in the last 72 hours. Lipid Profile: No results for input(s): CHOL, HDL, LDLCALC, TRIG, CHOLHDL, LDLDIRECT in the last 72 hours. Thyroid function studies: No results for input(s): TSH, T4TOTAL, T3FREE, THYROIDAB in the last 72 hours.  Invalid input(s): FREET3 Anemia work up: No results for input(s): VITAMINB12, FOLATE, FERRITIN, TIBC, IRON, RETICCTPCT in the last 72 hours. Sepsis Labs: Recent Labs  Lab 11/24/20 0938  WBC 10.4    Microbiology Recent Results (from the past 240 hour(s))  Resp Panel by RT-PCR (Flu A&B, Covid)     Status: None   Collection Time: 11/24/20  3:59 PM   Specimen: Nasopharyngeal(NP) swabs in vial transport medium  Result Value Ref Range Status   SARS Coronavirus 2 by RT PCR NEGATIVE  NEGATIVE Final    Comment: (NOTE) SARS-CoV-2 target nucleic acids are NOT DETECTED.  The SARS-CoV-2 RNA is generally detectable in upper respiratory specimens during the acute phase of infection. The lowest concentration of SARS-CoV-2 viral copies this assay can detect is 138 copies/mL. A negative result does not preclude SARS-Cov-2 infection and should not be used as the  sole basis for treatment or other patient management decisions. A negative result may occur with  improper specimen collection/handling, submission of specimen other than nasopharyngeal swab, presence of viral mutation(s) within the areas targeted by this assay, and inadequate number of viral copies(<138 copies/mL). A negative result must be combined with clinical observations, patient history, and epidemiological information. The expected result is Negative.  Fact Sheet for Patients:  BloggerCourse.com  Fact Sheet for Healthcare Providers:  SeriousBroker.it  This test is no t yet approved or cleared by the [place] FDA and  has been authorized for detection and/or diagnosis of SARS-CoV-2 by FDA under an Emergency Use Authorization (EUA). This EUA will remain  in effect (meaning this test can be used) for the duration of the COVID-19 declaration under Section 564(b)(1) of the Act, 21 U.S.C.section 360bbb-3(b)(1), unless the authorization is terminated  or revoked sooner.       Influenza A by PCR NEGATIVE NEGATIVE Final   Influenza B by PCR NEGATIVE NEGATIVE Final    Comment: (NOTE) The Xpert Xpress SARS-CoV-2/FLU/RSV plus assay is intended as an aid in the diagnosis of influenza from Nasopharyngeal swab specimens and should not be used as a sole basis for treatment. Nasal washings and aspirates are unacceptable for Xpert Xpress SARS-CoV-2/FLU/RSV testing.  Fact Sheet for Patients: BloggerCourse.com  Fact Sheet for Healthcare  Providers: SeriousBroker.it  This test is not yet approved or cleared by the [place] FDA and has been authorized for detection and/or diagnosis of SARS-CoV-2 by FDA under an Emergency Use Authorization (EUA). This EUA will remain in effect (meaning this test can be used) for the duration of the COVID-19 declaration under Section 564(b)(1) of the Act, 21 U.S.C. section 360bbb-3(b)(1), unless the authorization is terminated or revoked.  Performed at [facility], [address] [place], [place] [address]     Procedures and diagnostic studies:  CT Head Wo Contrast  Result Date: 11/24/2020 CLINICAL DATA:  Fall EXAM: CT HEAD WITHOUT CONTRAST TECHNIQUE: Contiguous axial images were obtained from the base of the skull through the vertex without intravenous contrast. COMPARISON:  None. FINDINGS: Brain: There is no evidence of acute intracranial hemorrhage, extra-axial fluid collection, or acute infarct. There is moderate parenchymal volume loss with disproportionate hippocampal atrophy. Foci of hypodensity in the subcortical and periventricular white matter likely reflect sequela of moderate chronic white matter microangiopathy. There is no mass lesion. There is no midline shift. Vascular: No hyperdense vessel or unexpected calcification. Skull: Normal. Negative for fracture or focal lesion. Sinuses/Orbits: The imaged paranasal sinuses are clear. The globes and orbits are unremarkable. Other: None. IMPRESSION: 1. No acute intracranial pathology. 2. Moderate parenchymal volume loss with disproportionate hippocampal atrophy and moderate chronic white matter microangiopathy. Electronically Signed   By: Lesia Hausen M.D.   On: 11/24/2020 14:05   DG Hip Unilat With Pelvis 2-3 Views Left  Result Date: 11/24/2020 CLINICAL DATA:  Left hip pain after fall EXAM: DG HIP (WITH OR WITHOUT PELVIS) 2-3V LEFT COMPARISON:  None. FINDINGS: There is no evidence of hip fracture  or dislocation. Sacrum and portions of the bilateral iliac bones can not be evaluated due to overlying bowel gas. There is no evidence of arthropathy or other focal bone abnormality. IMPRESSION: No evidence of displaced hip fracture. Electronically Signed   By: Allegra Lai M.D.   On: 11/24/2020 14:03               LOS: 0 days   Yazleemar Strassner  [facility]  Pager on [email or web address]. If 7PM-7AM, please contact night-coverage at [email or web address]     11/25/2020, 1:59 PM

## 2020-11-26 LAB — BASIC METABOLIC PANEL
Anion gap: 8 (ref 5–15)
BUN: 7 mg/dL — ABNORMAL LOW (ref 8–23)
CO2: 25 mmol/L (ref 22–32)
Calcium: 8.9 mg/dL (ref 8.9–10.3)
Chloride: 99 mmol/L (ref 98–111)
Creatinine, Ser: 0.43 mg/dL — ABNORMAL LOW (ref 0.44–1.00)
GFR, Estimated: >60 mL/min (ref >60)
Glucose, Bld: 129 mg/dL — ABNORMAL HIGH (ref 70–99)
Potassium: 3.8 mmol/L (ref 3.5–5.1)
Sodium: 132 mmol/L — ABNORMAL LOW (ref 135–145)

## 2020-11-26 LAB — URINE CULTURE

## 2020-11-26 LAB — PHOSPHORUS: Phosphorus: 2.4 mg/dL — ABNORMAL LOW (ref 2.5–4.6)

## 2020-11-26 MED ORDER — ADULT MULTIVITAMIN W/MINERALS CH
1.0000 | ORAL_TABLET | Freq: Every day | ORAL | Status: DC
  Administered 2020-11-27: 11:00:00 1 via ORAL
  Filled 2020-11-26 (×3): qty 1

## 2020-11-26 MED ORDER — K PHOS MONO-SOD PHOS DI & MONO 155-852-130 MG PO TABS
500.0000 mg | ORAL_TABLET | Freq: Three times a day (TID) | ORAL | Status: AC
  Administered 2020-11-26 – 2020-11-27 (×4): 500 mg via ORAL
  Filled 2020-11-26 (×5): qty 2

## 2020-11-26 MED ORDER — ENSURE ENLIVE PO LIQD
237.0000 mL | Freq: Two times a day (BID) | ORAL | Status: DC
  Administered 2020-11-26 – 2020-11-27 (×2): 237 mL via ORAL

## 2020-11-26 MED ORDER — ALUM & MAG HYDROXIDE-SIMETH 200-200-20 MG/5ML PO SUSP: 30.0000 mL | Freq: Four times a day (QID) | ORAL | Status: DC | PRN

## 2020-11-26 NOTE — Evaluation (Addendum)
Occupational Therapy Evaluation Patient Details Name: Kristina Watkins MRN: [id number] DOB: 1945/11/15 Today's Date: 11/26/2020   History of Present Illness Kristina Watkins is a 75 y.o. female with past medical history significant for advanced dementia who was brought to the hospital because of multiple falls at home.  According to husband, patient has become progressively weaker and has not been eating well   Clinical Impression   Pt seen for OT evaluation this date in setting of acute hospitalization d/t falls and weakness. Pt's spouse present and able to provide information as pt is poor historian. Pt's spouse reports that she is typically able to dress and toilet herself, but has required increased help with basic ADLs in the days leading up to hospital stay. Pt's spouse reports that he is available all the time as he quit work to help his wife. He reports that pt can typically walk w/o AD or assistance. Pt presents this date with gross weakness requiring MOD to MAX A +2 for bed mobility and MOD A +2 for STS transfer. Pt is resistant to activity with therapists and has fluctuating agitation throughout session. Pt with limited standing tolerance of only ~5 seconds or less with 2 persons assisting d/t pain in posterior L flank and dizziness. On ADL assessment, pt requires MIN A for sitting UB ADLs such as grooming, MOD/MAX A for LB ADLs, cues for initiating/sequencing throughout. Pt returned to bed with all needs met and in reach. Bed alarm set and spouse as well as telesitter present. Will continue to follow acutely. Anticipate pt will require STR in SNF setting to regain strength for fxl mobility and basic ADLs.      Recommendations for follow up therapy are one component of a multi-disciplinary discharge planning process, led by the attending physician.  Recommendations may be updated based on patient status, additional functional criteria and insurance authorization.   Follow Up Recommendations   SNF    Equipment Recommendations  Other (comment) (defer to next venue of care)    Recommendations for Other Services       Precautions / Restrictions Precautions Precautions: Fall Restrictions Weight Bearing Restrictions: No      Mobility Bed Mobility Overal bed mobility: Needs Assistance Bed Mobility: Rolling;Supine to Sit;Sit to Supine Rolling: Total assist;+2 for safety/equipment   Supine to sit: Mod assist;+2 for safety/equipment Sit to supine: Max assist;+2 for safety/equipment   General bed mobility comments: pt resistant to activity with therapist, requires gentle re-direction throughout, responds best to when spouse requests mobility.    Transfers Overall transfer level: Needs assistance Equipment used: 2 person hand held assist Transfers: Sit to/from Stand Sit to Stand: Mod assist;+2 physical assistance;+2 safety/equipment;From elevated surface         General transfer comment: hand held assist, unable to sequence use of AD. Cues for safety, gentle re-direction to attend to task. Pt aggitation waxes and wanes, poor undertanding of safety. Does c/o back pain and dizzines upon coming to stand, cannot tolerate longer than ~5 seconds.    Balance Overall balance assessment: Needs assistance Sitting-balance support: Bilateral upper extremity supported;Feet supported Sitting balance-Leahy Scale: Fair Sitting balance - Comments: requires UE support to sustain static sitting   Standing balance support: Bilateral upper extremity supported Standing balance-Leahy Scale: Poor Standing balance comment: P standing balance, tolerates only ~5 seconds, c/o dizziness. REquires bilateral UE support  ADL either performed or assessed with clinical judgement   ADL Overall ADL's : Needs assistance/impaired                                       General ADL Comments: MIN A for sitting UB ADLs such as grooming, MOD/MAX A for LB  ADLs, cues for initiating/sequencing throughout, cues for safety. MOD A +2 for bed mobility and transfers with HHA.     Vision   Additional Comments: unable to formally assess d/t confusion/cognition     Perception     Praxis      Pertinent Vitals/Pain Pain Assessment: Faces Faces Pain Scale: Hurts whole lot Pain Location: low back and right hip Pain Descriptors / Indicators: Grimacing;Guarding;Moaning Pain Intervention(s): Limited activity within patient's tolerance;Monitored during session;Repositioned (recommended ice to spouse when he asked about ice or heat to help treat pain)     Hand Dominance     Extremity/Trunk Assessment Upper Extremity Assessment Upper Extremity Assessment: Generalized weakness   Lower Extremity Assessment Lower Extremity Assessment: Generalized weakness   Cervical / Trunk Assessment Cervical / Trunk Assessment: Normal   Communication Communication Communication: No difficulties   Cognition Arousal/Alertness: Awake/alert Behavior During Therapy: Agitated Overall Cognitive Status: History of cognitive impairments - at baseline                                 General Comments: Patient disoriented to time, place, and situation and was unable to remember after reminded of place and situation. Was resistant to answering questions about her name. Requred max cuing/encouragement to participate in session. Session was limited by her willingness to participate. Responded best to requests and assistance from husband.   General Comments       Exercises Other Exercises Other Exercises: OT ed with pt and spouse re: role of OT and f/u recommendations   Shoulder Instructions      Home Living Family/patient expects to be discharged to:: Private residence Living Arrangements: Spouse/significant other Available Help at Discharge: Family Type of Home: House Home Access: Stairs to enter Technical brewer of Steps: 4-5 Entrance  Stairs-Rails: Can reach both;Left;Right Home Layout: One level               Home Equipment: None          Prior Functioning/Environment Level of Independence: Needs assistance  Gait / Transfers Assistance Needed: Per husband she ambulates in home and short community distances with HHA. She has needed help with bed mobility and transfers. ADL's / Homemaking Assistance Needed: needs assistance with ADS and IADLs   Comments: Husband reports ~5 fall or near falls in the last 6 months.        OT Problem List: Decreased strength;Decreased activity tolerance;Impaired balance (sitting and/or standing);Decreased cognition;Decreased safety awareness;Decreased knowledge of use of DME or AE;Decreased knowledge of precautions;Pain      OT Treatment/Interventions: Self-care/ADL training;Therapeutic exercise;DME and/or AE instruction;Therapeutic activities;Patient/family education;Balance training    OT Goals(Current goals can be found in the care plan section) Acute Rehab OT Goals Patient Stated Goal: to control pain to improve mobilitiy so she can go home with husband OT Goal Formulation: With family Time For Goal Achievement: 12/10/20 Potential to Achieve Goals: Fair ADL Goals Pt Will Perform Upper Body Dressing: with supervision;sitting Pt Will Perform Lower Body Dressing: with min guard assist;sit to/from stand (  with <10% cues to sequence familiar dressing task) Pt Will Transfer to Toilet: with min guard assist;ambulating;bedside commode (with LRAD to [facility] ~10' away to increase tolerance for fxl HH distances.)  OT Frequency: Min 1X/week   Barriers to D/C:            Co-evaluation PT/OT/SLP Co-Evaluation/Treatment: Yes Reason for Co-Treatment: Complexity of the patient's impairments (multi-system involvement);Necessary to address cognition/behavior during functional activity PT goals addressed during session: Mobility/safety with mobility OT goals addressed during session: ADL's  and self-care      AM-PAC OT "6 Clicks" Daily Activity     Outcome Measure Help from another person eating meals?: A Little Help from another person taking care of personal grooming?: A Little Help from another person toileting, which includes using toliet, bedpan, or urinal?: A Lot Help from another person bathing (including washing, rinsing, drying)?: A Lot Help from another person to put on and taking off regular upper body clothing?: A Little Help from another person to put on and taking off regular lower body clothing?: A Lot 6 Click Score: 15   End of Session Equipment Utilized During Treatment: Gait belt Nurse Communication: Mobility status  Activity Tolerance: Treatment limited secondary to agitation Patient left: in bed;with call bell/phone within reach;with bed alarm set;with family/visitor present (spouse and telesitter, purewick and brief re-applied.)  OT Visit Diagnosis: Unsteadiness on feet (R26.81);History of falling (Z91.81);Pain;Muscle weakness (generalized) (M62.81);Other symptoms and signs involving cognitive function Pain - Right/Left: Left Pain - part of body:  (flank)                Time: 6283-6629 OT Time Calculation (min): 42 min Charges:  OT General Charges $OT Visit: 1 Visit OT Evaluation $OT Eval Moderate Complexity: 1 Mod OT Treatments $Self Care/Home Management : 8-22 mins  Gerrianne Scale, MS, OTR/L ascom [phone] 11/26/20, 2:52 PM

## 2020-11-26 NOTE — Progress Notes (Signed)
Initial Nutrition Assessment  DOCUMENTATION CODES:   Not applicable  INTERVENTION:   Ensure Enlive po BID, each supplement provides 350 kcal and 20 grams of protein  Magic cup TID with meals, each supplement provides 290 kcal and 9 grams of protein  MVI po daily  Dysphagia 3 diet   Pt at high refeed risk; recommend monitor potassium, magnesium and phosphorus labs daily until stable  NUTRITION DIAGNOSIS:   Inadequate oral intake related to chronic illness (dementia) as evidenced by per patient/family report  GOAL:   Patient will meet greater than or equal to 90% of their needs  MONITOR:   PO intake, Supplement acceptance, Labs, Weight trends, Skin, I & O's  REASON FOR ASSESSMENT:   Malnutrition Screening Tool    ASSESSMENT:   75 y.o. female with past medical history significant for advanced dementia who was brought to the hospital because of multiple falls at home.  RD working remotely.  Unable to speak with pt r/t dementia. Per family report, pt with poor appetite and oral intake pta. RD suspects pt with poor oral intake at baseline r/t dementia. RD will add supplements and MVI to help pt meet her estimated needs. RD will also change pt to a mechanical soft diet. Pt is likely at high refeed risk. There is no documented weight history in chart to determine if any recent significant weight loss. Pt is at high risk for malnutrition. RD will obtain NFPE at follow up.   Medications reviewed and include: lovenox, NaCl w/ KCl @75ml /hr  Labs reviewed: Na 132(L), K 3.8 wnl, BUN 7(L), creat 0.43(L), P 2.4(L)  NUTRITION - FOCUSED PHYSICAL EXAM: Unable to perform at this time   Diet Order:   Diet Order             DIET DYS 3 Room service appropriate? No; Fluid consistency: Thin  Diet effective now                   EDUCATION NEEDS:   No education needs have been identified at this time  Skin:  Skin Assessment: Reviewed RN Assessment (ecchymosis)  Last BM:   9/15  Height:   Ht Readings from Last 1 Encounters:  11/24/20 5\' 2"  (1.575 m)    Weight:   Wt Readings from Last 1 Encounters:  11/24/20 51.4 kg    Ideal Body Weight:  50 kg  BMI:  Body mass index is 20.73 kg/m.  Estimated Nutritional Needs:   Kcal:  1400-1600kcal/day  Protein:  70-80g/day  Fluid:  1.3-1.5L/day  MS, RD, LDN Please refer to [facility] for RD and/or RD on-call/weekend/after hours pager

## 2020-11-26 NOTE — Progress Notes (Signed)
Progress Note    Kristina Watkins  NWG:[id number] DOB: 17-Sep-1945  DOA: 11/24/2020 PCP: Pcp, No      Brief Narrative:    Medical records reviewed and are as summarized below:  Kristina Watkins is a 75 y.o. female with past medical history significant for advanced dementia who was brought to the hospital because of multiple falls at home.  According to husband, patient has become progressively weaker and has not been eating well.  He said he gave him donepezil but he noticed that patient had become more "delusional" so he stopped it after 3 days.      Assessment/Plan:   Principal Problem:   Acute metabolic encephalopathy Active Problems:   AMS (altered mental status)   Dementia with behavioral disturbance (HCC)   Essential hypertension   Hyponatremia    Body mass index is 20.73 kg/m.   Hyponatremia: Improving.  Continue IV fluids with plans to discontinue IV fluids later today.  Repeat BMP tomorrow.  Acute metabolic encephalopathy superimposed on underlying dementia: This may be due to dehydration/hyponatremia.  Continue supportive care.  Hypokalemia: Improved.    Mild hypophosphatemia: Replete with potassium phosphate.  Hypertension: Continue amlodipine  Multiple falls at home/debility: PT recommends discharge to SNF.  Her husband is hoping that patient will get a little stronger and her pain will be well controlled so that he can take her home.     Diet Order             DIET DYS 3 Room service appropriate? No; Fluid consistency: Thin  Diet effective now                      Consultants: None  Procedures: None    Medications:    amLODipine  5 mg Oral Daily   enoxaparin (LOVENOX) injection  40 mg Subcutaneous Q24H   feeding supplement  237 mL Oral BID BM   [START ON 11/27/2020] multivitamin with minerals  1 tablet Oral Daily   Continuous Infusions:  0.9 % NaCl with KCl 40 mEq / L 75 mL/hr at 11/26/20 0154     Anti-infectives  (From admission, onward)    Start     Dose/Rate Route Frequency Ordered Stop   11/24/20 1715  cefTRIAXone (ROCEPHIN) 1 g in sodium chloride 0.9 % 100 mL IVPB        1 g 200 mL/hr over 30 Minutes Intravenous  Once 11/24/20 1703 11/24/20 1839              Family Communication/Anticipated D/C date and plan/Code Status   DVT prophylaxis: enoxaparin (LOVENOX) injection 40 mg Start: 11/24/20 1645     Code Status: Full Code  Family Communication: Husband at the bedside Disposition Plan:    Status is: Observation  The patient will require care spanning > 2 midnights and should be moved to inpatient because: IV treatments appropriate due to intensity of illness or inability to take PO and Inpatient level of care appropriate due to severity of illness  Dispo: The patient is from: Home              Anticipated d/c is to: SNF              Patient currently is not medically stable to d/c.   Difficult to place patient No           Subjective:   Interval events noted.  She is confused and cannot provide any history.  Her husband  was at the bedside.  Her husband said that patient has been complaining of back pain since the fall.  However, he thinks the pain is better since the patient is able to move around more.  Objective:    Vitals:   11/26/20 0300 11/26/20 0627 11/26/20 0849 11/26/20 1112  BP: (!) 163/87  (!) (P) 159/76 (!) 154/99  Pulse: (!) 57 72 (P) 83 75  Resp: 17   18  Temp: (!) 97.5 F (36.4 C)   98.3 F (36.8 C)  TempSrc: Axillary     SpO2: (!) 81% 97%  96%  Weight:      Height:       No data found.   Intake/Output Summary (Last 24 hours) at 11/26/2020 1133 Last data filed at 11/26/2020 1125 Gross per 24 hour  Intake 1772.5 ml  Output 3100 ml  Net -1327.5 ml   Filed Weights   11/24/20 2156  Weight: 51.4 kg    Exam:  GEN: NAD SKIN: No rash EYES: EOMI ENT: MMM CV: RRR PULM: CTA B ABD: soft, ND, NT, +BS CNS: AAO x 1 (person), confused,  non focal EXT: No edema or tenderness         Data Reviewed:   I have personally reviewed following labs and imaging studies:  Labs: Labs show the following:   Basic Metabolic Panel: Recent Labs  Lab 11/24/20 0938 11/25/20 0924 11/26/20 0431  NA 128* 131* 132*  K 4.0 3.2* 3.8  CL 94* 97* 99  CO2 27 26 25   GLUCOSE 137* 148* 129*  BUN 9 7* 7*  CREATININE 0.61 0.46 0.43*  CALCIUM 8.8* 8.9 8.9  MG  --  1.8  --   PHOS  --  2.4* 2.4*   GFR Estimated Creatinine Clearance: 46.6 mL/min (A) (by C-G formula based on SCr of 0.43 mg/dL (L)). Liver Function Tests: No results for input(s): AST, ALT, ALKPHOS, BILITOT, PROT, ALBUMIN in the last 168 hours. No results for input(s): LIPASE, AMYLASE in the last 168 hours. No results for input(s): AMMONIA in the last 168 hours. Coagulation profile No results for input(s): INR, PROTIME in the last 168 hours.  CBC: Recent Labs  Lab 11/24/20 0938  WBC 10.4  HGB 14.2  HCT 38.9  MCV 93.7  PLT 340   Cardiac Enzymes: No results for input(s): CKTOTAL, CKMB, CKMBINDEX, TROPONINI in the last 168 hours. BNP (last 3 results) No results for input(s): PROBNP in the last 8760 hours. CBG: No results for input(s): GLUCAP in the last 168 hours. D-Dimer: No results for input(s): DDIMER in the last 72 hours. Hgb A1c: No results for input(s): HGBA1C in the last 72 hours. Lipid Profile: No results for input(s): CHOL, HDL, LDLCALC, TRIG, CHOLHDL, LDLDIRECT in the last 72 hours. Thyroid function studies: No results for input(s): TSH, T4TOTAL, T3FREE, THYROIDAB in the last 72 hours.  Invalid input(s): FREET3 Anemia work up: No results for input(s): VITAMINB12, FOLATE, FERRITIN, TIBC, IRON, RETICCTPCT in the last 72 hours. Sepsis Labs: Recent Labs  Lab 11/24/20 0938  WBC 10.4    Microbiology Recent Results (from the past 240 hour(s))  Resp Panel by RT-PCR (Flu A&B, Covid)     Status: None   Collection Time: 11/24/20  3:59 PM    Specimen: Nasopharyngeal(NP) swabs in vial transport medium  Result Value Ref Range Status   SARS Coronavirus 2 by RT PCR NEGATIVE NEGATIVE Final    Comment: (NOTE) SARS-CoV-2 target nucleic acids are NOT DETECTED.  The SARS-CoV-2 RNA  is generally detectable in upper respiratory specimens during the acute phase of infection. The lowest concentration of SARS-CoV-2 viral copies this assay can detect is 138 copies/mL. A negative result does not preclude SARS-Cov-2 infection and should not be used as the sole basis for treatment or other patient management decisions. A negative result may occur with  improper specimen collection/handling, submission of specimen other than nasopharyngeal swab, presence of viral mutation(s) within the areas targeted by this assay, and inadequate number of viral copies(<138 copies/mL). A negative result must be combined with clinical observations, patient history, and epidemiological information. The expected result is Negative.  Fact Sheet for Patients:  BloggerCourse.com  Fact Sheet for Healthcare Providers:  SeriousBroker.it  This test is no t yet approved or cleared by the [place] FDA and  has been authorized for detection and/or diagnosis of SARS-CoV-2 by FDA under an Emergency Use Authorization (EUA). This EUA will remain  in effect (meaning this test can be used) for the duration of the COVID-19 declaration under Section 564(b)(1) of the Act, 21 U.S.C.section 360bbb-3(b)(1), unless the authorization is terminated  or revoked sooner.       Influenza A by PCR NEGATIVE NEGATIVE Final   Influenza B by PCR NEGATIVE NEGATIVE Final    Comment: (NOTE) The Xpert Xpress SARS-CoV-2/FLU/RSV plus assay is intended as an aid in the diagnosis of influenza from Nasopharyngeal swab specimens and should not be used as a sole basis for treatment. Nasal washings and aspirates are unacceptable for Xpert  Xpress SARS-CoV-2/FLU/RSV testing.  Fact Sheet for Patients: BloggerCourse.com  Fact Sheet for Healthcare Providers: SeriousBroker.it  This test is not yet approved or cleared by the [place] FDA and has been authorized for detection and/or diagnosis of SARS-CoV-2 by FDA under an Emergency Use Authorization (EUA). This EUA will remain in effect (meaning this test can be used) for the duration of the COVID-19 declaration under Section 564(b)(1) of the Act, 21 U.S.C. section 360bbb-3(b)(1), unless the authorization is terminated or revoked.  Performed at [facility], [address], [place], [place] [address]   Urine Culture     Status: Abnormal   Collection Time: 11/24/20  3:59 PM   Specimen: Urine, Random  Result Value Ref Range Status   Specimen Description   Final    URINE, RANDOM Performed at [facility], [address], [place], [place] [address]    Special Requests   Final    NONE Performed at [facility], [address] [place], [place] [address]    Culture MULTIPLE SPECIES PRESENT, SUGGEST RECOLLECTION (A)  Final   Report Status 11/26/2020 FINAL  Final    Procedures and diagnostic studies:  CT Head Wo Contrast  Result Date: 11/24/2020 CLINICAL DATA:  Fall EXAM: CT HEAD WITHOUT CONTRAST TECHNIQUE: Contiguous axial images were obtained from the base of the skull through the vertex without intravenous contrast. COMPARISON:  None. FINDINGS: Brain: There is no evidence of acute intracranial hemorrhage, extra-axial fluid collection, or acute infarct. There is moderate parenchymal volume loss with disproportionate hippocampal atrophy. Foci of hypodensity in the subcortical and periventricular white matter likely reflect sequela of moderate chronic white matter microangiopathy. There is no mass lesion. There is no midline shift. Vascular: No hyperdense vessel or unexpected calcification.  Skull: Normal. Negative for fracture or focal lesion. Sinuses/Orbits: The imaged paranasal sinuses are clear. The globes and orbits are unremarkable. Other: None. IMPRESSION: 1. No acute intracranial pathology. 2. Moderate parenchymal volume loss with disproportionate hippocampal atrophy  and moderate chronic white matter microangiopathy. Electronically Signed   By: Lesia Hausen M.D.   On: 11/24/2020 14:05   DG Hip Unilat With Pelvis 2-3 Views Left  Result Date: 11/24/2020 CLINICAL DATA:  Left hip pain after fall EXAM: DG HIP (WITH OR WITHOUT PELVIS) 2-3V LEFT COMPARISON:  None. FINDINGS: There is no evidence of hip fracture or dislocation. Sacrum and portions of the bilateral iliac bones can not be evaluated due to overlying bowel gas. There is no evidence of arthropathy or other focal bone abnormality. IMPRESSION: No evidence of displaced hip fracture. Electronically Signed   By: Allegra Lai M.D.   On: 11/24/2020 14:03               LOS: 1 day   Dub Maclellan  [facility]   Pager on [email or web address]. If 7PM-7AM, please contact night-coverage at [email or web address]     11/26/2020, 11:33 AM

## 2020-11-26 NOTE — Evaluation (Signed)
Physical Therapy Evaluation Patient Details Name: Kristina Watkins MRN: [id number] DOB: [birth year] Today's Date: 11/26/2020  History of Present Illness  Kristina Watkins is a 75 y.o. female with past medical history significant for advanced dementia who was brought to the hospital because of multiple falls at home.  According to husband, patient has become progressively weaker and has not been eating well   Clinical Impression  Patient received reclining in bed with husband John at bedside and sitter in room. Patient alert but not oriented and was unable to provide a sufficient history so history and PLOF obtained with assistance from husband John. Patient lives in a single story home with 4-5 steps to enter with her husband Jonny Ruiz who quit his job to care for her full time. Prior to hospitalization he provided hand held assist with household and short community ambulation and assisted her with bed mobility, transfers, ADLs, and IADLs. Reports she has needed increasing amounts of help lately. Upon PT evaluation, session was limited patient's lack of awareness of her situation and deficits and she required max encouragement and cuing to participate as much as she was able. Session also limited by dizziness and pain with attempts to stand and pain with bed mobility. Patient required mod A +2 HHA to safely perform sit <> stand from edge of bed and was unable to tolerate standing more than 5 seconds, requesting to sit down immediately. Required mod A +2 to total A+2 for bed mobility. Was most cooperative with husband assisting. Husband used B UE to pull patient's arms during mobility. Attempted to take BP when pt reported dizziness but unable to get accurate reading due to patient not being able to keep her UE still (due to pain/confusion). Patient is currently demonstrating need for functional assistance appropriate at SNF level and demonstrates a decline in functional independence and strength. PT recommends she  discharge to SNF setting for short term rehab prior to returning home. However, noted that pt may experience increased confusion and agitation in that setting due to to her cognitive deficits. Patient would benefit from management of limiting condition by skilled physical therapy to address remaining impairments and functional limitations to work towards stated goals and return to PLOF or maximal functional independence.       Recommendations for follow up therapy are one component of a multi-disciplinary discharge planning process, led by the attending physician.  Recommendations may be updated based on patient status, additional functional criteria and insurance authorization.  Follow Up Recommendations SNF;Supervision/Assistance - 24 hour    Equipment Recommendations  Rolling walker with 5" wheels;3in1 (PT)    Recommendations for Other Services       Precautions / Restrictions Precautions Precautions: Fall      Mobility  Bed Mobility Overal bed mobility: Needs Assistance Bed Mobility: Rolling;Supine to Sit;Sit to Supine Rolling: Total assist;+2 for safety/equipment   Supine to sit: Mod assist;+2 for safety/equipment Sit to supine: Max assist;+2 for safety/equipment   General bed mobility comments: Patient appeared limited by  confusion or resistance (due to confusion about situation) to following directions and struggled due to weakness/discomfort with attempts to move.    Transfers Overall transfer level: Needs assistance Equipment used: 2 person hand held assist Transfers: Sit to/from Stand Sit to Stand: Mod assist;+2 physical assistance;+2 safety/equipment;From elevated surface         General transfer comment: Patient completed sit <> stand from edge of hospital bed with mod A +2 for safety and physical assist. Patient limited by complaints of  dizziness and pain and needed to sit back down immediately. Attempt to take blood pressure unsuccessful due to patient too  agitated/confused to keep arm still for accurate reading.  Ambulation/Gait             General Gait Details: Unable to safely take steps today.  Stairs            Wheelchair Mobility    Modified Rankin (Stroke Patients Only)       Balance Overall balance assessment: Needs assistance Sitting-balance support: Bilateral upper extremity supported;Feet supported Sitting balance-Leahy Scale: Fair Sitting balance - Comments: Able to sit at edge of bed without loss of balance. Needs help to shift to new position.   Standing balance support: Bilateral upper extremity supported Standing balance-Leahy Scale: Poor Standing balance comment: Pateint requires support at both UE to maintain balance. Reports dizziness upon standing and immediately requests to sit.                             Pertinent Vitals/Pain Pain Assessment: Faces Faces Pain Scale: Hurts whole lot Pain Location: low back and right hip Pain Descriptors / Indicators: Grimacing;Guarding;Moaning Pain Intervention(s): Limited activity within patient's tolerance;Repositioned;Monitored during session (reccomended ice to husband when he asked about ice or heat)    Home Living Family/patient expects to be discharged to:: Private residence Living Arrangements: Spouse/significant other Available Help at Discharge: Family Type of Home: House Home Access: Stairs to enter Entrance Stairs-Rails: Can reach both;Left;Right Entrance Stairs-Number of Steps: 4-5 Home Layout: One level Home Equipment: None      Prior Function Level of Independence: Needs assistance   Gait / Transfers Assistance Needed: Per husband she ambulates in home and short community distances with HHA. She has needed help with bed mobility and transfers.  ADL's / Homemaking Assistance Needed: needs assistance with ADS and IADLs  Comments: Husband reports ~5 fall or near falls in the last 6 months.     Hand Dominance         Extremity/Trunk Assessment   Upper Extremity Assessment Upper Extremity Assessment: Defer to OT evaluation    Lower Extremity Assessment Lower Extremity Assessment: Generalized weakness    Cervical / Trunk Assessment Cervical / Trunk Assessment: Normal  Communication   Communication: No difficulties  Cognition Arousal/Alertness: Awake/alert Behavior During Therapy: Agitated Overall Cognitive Status: History of cognitive impairments - at baseline                                 General Comments: Patient disoriented to time, place, and situation and was unable to remember after reminded of place and situation. Was resistant to answering questions about her name. Requred max cuing/encouragement to participate in session. Session was limited by her willingness to participate. Responded best to requests and assistance from husband.      General Comments General comments (skin integrity, edema, etc.): echymosis noted at left low back and right thigh    Exercises Other Exercises Other Exercises: educated pt/family on purpose of PT in acute care setting. Educated on some options for discharge planning.   Assessment/Plan    PT Assessment Patient needs continued PT services  PT Problem List Decreased strength;Decreased range of motion;Decreased cognition;Decreased activity tolerance;Decreased knowledge of use of DME;Decreased balance;Decreased safety awareness;Decreased mobility;Decreased knowledge of precautions;Pain       PT Treatment Interventions DME instruction;Balance training;Gait training;Neuromuscular re-education;Modalities;Stair training;Cognitive remediation;Functional mobility training;Patient/family  education;Therapeutic activities;Therapeutic exercise    PT Goals (Current goals can be found in the Care Plan section)  Acute Rehab PT Goals Patient Stated Goal: to control pain to improve mobilitiy so she can go home with husband PT Goal Formulation: With  family Time For Goal Achievement: 12/10/20 Potential to Achieve Goals: Fair    Frequency Min 2X/week   Barriers to discharge Decreased caregiver support Patient needs constant supervision and physical assistance with all self care and mobility. She is not sufficiently aware of deficits and lacks safety awareness.    Co-evaluation PT/OT/SLP Co-Evaluation/Treatment: Yes Reason for Co-Treatment: Complexity of the patient's impairments (multi-system involvement);Necessary to address cognition/behavior during functional activity PT goals addressed during session: Mobility/safety with mobility;Balance;Strengthening/ROM         AM-PAC PT "6 Clicks" Mobility  Outcome Measure Help needed turning from your back to your side while in a flat bed without using bedrails?: A Lot Help needed moving from lying on your back to sitting on the side of a flat bed without using bedrails?: A Lot Help needed moving to and from a bed to a chair (including a wheelchair)?: Total Help needed standing up from a chair using your arms (e.g., wheelchair or bedside chair)?: A Lot Help needed to walk in hospital room?: Total Help needed climbing 3-5 steps with a railing? : Total 6 Click Score: 9    End of Session   Activity Tolerance: Patient limited by pain;Treatment limited secondary to agitation;Other (comment) (dizziness upon standing) Patient left: in bed;with bed alarm set;with nursing/sitter in room;with family/visitor present Nurse Communication: Mobility status PT Visit Diagnosis: Unsteadiness on feet (R26.81);Other abnormalities of gait and mobility (R26.89);Repeated falls (R29.6);Muscle weakness (generalized) (M62.81);Difficulty in walking, not elsewhere classified (R26.2)    Time: 8832-5498 PT Time Calculation (min) (ACUTE ONLY): 40 min   Charges:     PT Treatments $Therapeutic Activity: 8-22 mins        Luretha Murphy. Ilsa Iha, PT, DPT 11/26/20, 9:42 AM

## 2020-11-27 LAB — BASIC METABOLIC PANEL
Anion gap: 9 (ref 5–15)
BUN: 9 mg/dL (ref 8–23)
CO2: 27 mmol/L (ref 22–32)
Calcium: 9.3 mg/dL (ref 8.9–10.3)
Chloride: 96 mmol/L — ABNORMAL LOW (ref 98–111)
Creatinine, Ser: 0.46 mg/dL (ref 0.44–1.00)
GFR, Estimated: >60 mL/min (ref >60)
Glucose, Bld: 145 mg/dL — ABNORMAL HIGH (ref 70–99)
Potassium: 3.6 mmol/L (ref 3.5–5.1)
Sodium: 132 mmol/L — ABNORMAL LOW (ref 135–145)

## 2020-11-27 LAB — VITAMIN B12: Vitamin B-12: 1130 pg/mL — ABNORMAL HIGH (ref 180–914)

## 2020-11-27 LAB — PHOSPHORUS: Phosphorus: 3.4 mg/dL (ref 2.5–4.6)

## 2020-11-27 MED ORDER — QUETIAPINE FUMARATE 25 MG PO TABS: 25.0000 mg | ORAL_TABLET | Freq: Every evening | ORAL | Status: DC

## 2020-11-27 MED ORDER — QUETIAPINE FUMARATE 25 MG PO TABS: 25.0000 mg | ORAL_TABLET | Freq: Two times a day (BID) | ORAL | Status: DC

## 2020-11-27 MED ORDER — AMLODIPINE BESYLATE 5 MG PO TABS: 5.0000 mg | ORAL_TABLET | Freq: Every day | ORAL | 0 refills | Status: AC

## 2020-11-27 NOTE — Progress Notes (Signed)
Physical Therapy Treatment Patient Details Name: Kristina Watkins MRN: [id number] DOB: [birth year] Today's Date: 11/27/2020   History of Present Illness Kristina Watkins is a 75 y.o. female with past medical history significant for advanced dementia who was brought to the hospital because of multiple falls at home.  According to husband, patient has become progressively weaker and has not been eating well    PT Comments    Pt seen per husband request.  Husband voicing frustrations over discharge plan and initially stating he wants to take pt home but is concerned that she is not moving well and needing too much assist.    Pt is able to get to EOB with increased time and cues but only requires light assist.  She is able to get to EOB with good balance in sitting.  Stand to RW with min assist pulling up on walker despite cues.  Husband stated she does not use a RW at home and he just holds her hands to walk.  She is able to transfer to recliner at bedside.  With min a x 1.  After seated rest she is able to walk to/from door with RW and min assist.  Slow and generally unsteady gait but no LOB.  Some assist to turn walker.  Remains in recliner for breakfast with alarm on and husband in room.    Discussion with husband regarding discharge plan.  She has made significant improvements over night with mobility.  He feels she remains "drugged" and wants to know how she would do without pain meds.  Explained pt may continue to have some soreness from falls and pain meds are typically used to allow for increased mobility while recovering.  He stated he would like to look into rehab if it is an option for her.  Given improvement in mobility today she does have good potential for improvement.  Pt is somewhat challenging as she does prefer to take instruction from husband.  He could benefit from continued education on how to help pt with mobility in the home and setting up increased services in the home as it seems  they have been struggling for some time with decreasing mobility.  They have no equipment in the home.  SNF would be a good venue for increasing overall strength, education and discharge planning.    Will discuss with MD and RN.   If pt discharge home, pt will benefit from RW, commode and HHPT/OT.     Recommendations for follow up therapy are one component of a multi-disciplinary discharge planning process, led by the attending physician.  Recommendations may be updated based on patient status, additional functional criteria and insurance authorization.  Follow Up Recommendations  SNF     Equipment Recommendations  Rolling walker with 5" wheels;3in1 (PT)    Recommendations for Other Services       Precautions / Restrictions Precautions Precautions: Fall Restrictions Weight Bearing Restrictions: No     Mobility  Bed Mobility Overal bed mobility: Needs Assistance Bed Mobility: Supine to Sit Rolling: Min guard   Supine to sit: Min assist     General bed mobility comments: cues for direction and movement but only minimal tactile assist.  increased time.    Transfers Overall transfer level: Needs assistance Equipment used: Rolling walker (2 wheeled) Transfers: Sit to/from Stand Sit to Stand: Min assist            Ambulation/Gait Ambulation/Gait assistance: Min guard;Min assist Gait Distance (Feet): 20 Feet Assistive device: Rolling walker (  2 wheeled) Gait Pattern/deviations: Step-through pattern;Decreased step length - right;Decreased step length - left;Narrow base of support Gait velocity: decreased   General Gait Details: able to walk to door and back with min gaurd and cues.   Stairs             Wheelchair Mobility    Modified Rankin (Stroke Patients Only)       Balance Overall balance assessment: Needs assistance Sitting-balance support: Bilateral upper extremity supported;Feet supported Sitting balance-Leahy Scale: Good     Standing  balance support: Bilateral upper extremity supported Standing balance-Leahy Scale: Poor Standing balance comment: walker for balance but no buckling, generally unsteady but only light assit for care                            Cognition Arousal/Alertness: Awake/alert Behavior During Therapy: Agitated Overall Cognitive Status: History of cognitive impairments - at baseline                                 General Comments: Pt prefers to take instruction from husband      Exercises      General Comments        Pertinent Vitals/Pain Pain Assessment: Faces Faces Pain Scale: Hurts little more Pain Location: low back and right hip Pain Descriptors / Indicators: Grimacing;Guarding;Moaning Pain Intervention(s): Limited activity within patient's tolerance;Monitored during session;Repositioned    Home Living                      Prior Function            PT Goals (current goals can now be found in the care plan section) Progress towards PT goals: Progressing toward goals    Frequency    Min 2X/week      PT Plan Current plan remains appropriate    Co-evaluation              AM-PAC PT "6 Clicks" Mobility   Outcome Measure  Help needed turning from your back to your side while in a flat bed without using bedrails?: A Little Help needed moving from lying on your back to sitting on the side of a flat bed without using bedrails?: A Little Help needed moving to and from a bed to a chair (including a wheelchair)?: A Little Help needed standing up from a chair using your arms (e.g., wheelchair or bedside chair)?: A Little Help needed to walk in hospital room?: A Little Help needed climbing 3-5 steps with a railing? : A Lot 6 Click Score: 17    End of Session Equipment Utilized During Treatment: Gait belt Activity Tolerance: Patient tolerated treatment well Patient left: in chair;with call bell/phone within reach;with chair alarm  set;with family/visitor present Nurse Communication: Mobility status PT Visit Diagnosis: Unsteadiness on feet (R26.81);Other abnormalities of gait and mobility (R26.89);Repeated falls (R29.6);Muscle weakness (generalized) (M62.81);Difficulty in walking, not elsewhere classified (R26.2)     Time: 1610-9604 PT Time Calculation (min) (ACUTE ONLY): 26 min  Charges:  $Gait Training: 8-22 mins $Therapeutic Activity: 8-22 mins                    Danielle Dess, PTA 11/27/20, 9:34 AM

## 2020-11-27 NOTE — Plan of Care (Signed)
Patient oriented only to self. Period of agitation after husband left bedside, attempting to exit the bed and removing PIVs and clothing; PRN haldol given and effective. Patient removed second PIV despite protective wrapping. New PIV placed and wrapped, pt no longer playing with taping. Incontinence care frequently provided, adequate UO but no BM this shift. Fall/safety precautions in place, rounding performed.

## 2020-11-27 NOTE — Progress Notes (Addendum)
Progress Note    Tomasita Watkins  AYT:[id number] DOB: [birth year]  DOA: 11/24/2020 PCP: Pcp, No      Brief Narrative:    Medical records reviewed and are as summarized below:  Kristina Watkins is a 75 y.o. female with past medical history significant for advanced dementia who was brought to the hospital because of multiple falls at home.  According to husband, patient has become progressively weaker and has not been eating well.  He said he gave him donepezil but he noticed that patient had become more "delusional" so he stopped it after 3 days.      Assessment/Plan:   Principal Problem:   Acute metabolic encephalopathy Active Problems:   AMS (altered mental status)   Dementia with behavioral disturbance (HCC)   Essential hypertension   Hyponatremia    Body mass index is 20.73 kg/m.   Hyponatremia: Improved.  IV fluids have been discontinued.  Acute metabolic encephalopathy superimposed on underlying dementia: Patient likely has progressive dementia as well.  Continue supportive care.  Her husband does not want her to have any Haldol or Seroquel.  He wants to see how she does without any of these medicines.  Hypokalemia and hypophosphatemia: Improved  Hypertension: Continue amlodipine  Multiple falls at home/debility: PT recommends discharge to SNF.  Her husband was going back and forth and  was undecided whether to have patient go to SNF or take her home.  He has decided to take patient home.  Patient was discharged today but her husband prefers to take her home tomorrow.      Diet Order             Diet - low sodium heart healthy           DIET DYS 3 Room service appropriate? No; Fluid consistency: Thin  Diet effective now                      Consultants: None  Procedures: None    Medications:    amLODipine  5 mg Oral Daily   enoxaparin (LOVENOX) injection  40 mg Subcutaneous Q24H   feeding supplement  237 mL Oral BID BM    multivitamin with minerals  1 tablet Oral Daily   phosphorus  500 mg Oral TID   Continuous Infusions:     Anti-infectives (From admission, onward)    Start     Dose/Rate Route Frequency Ordered Stop   11/24/20 1715  cefTRIAXone (ROCEPHIN) 1 g in sodium chloride 0.9 % 100 mL IVPB        1 g 200 mL/hr over 30 Minutes Intravenous  Once 11/24/20 1703 11/24/20 1839              Family Communication/Anticipated D/C date and plan/Code Status   DVT prophylaxis: enoxaparin (LOVENOX) injection 40 mg Start: 11/24/20 1645     Code Status: Full Code  Family Communication: Husband at the bedside Disposition Plan:    Status is: Observation  The patient will require care spanning > 2 midnights and should be moved to inpatient because: IV treatments appropriate due to intensity of illness or inability to take PO and Inpatient level of care appropriate due to severity of illness  Dispo: The patient is from: Home              Anticipated d/c is to: SNF              Patient currently is not medically stable to  d/c.   Difficult to place patient No           Subjective:   Interval events noted.  She is unable to provide any history.  Her husband was at the bedside.  Objective:    Vitals:   11/26/20 2010 11/26/20 2339 11/27/20 0456 11/27/20 0758  BP: 139/77 (!) 141/81 (!) 156/95 128/86  Pulse: 88 85 88 77  Resp: 16 15 16 17   Temp: 97.7 F (36.5 C) 98 F (36.7 C) 98.1 F (36.7 C) 98.4 F (36.9 C)  TempSrc: Oral Oral Oral Oral  SpO2: 96% 97% 95% 97%  Weight:      Height:       No data found.   Intake/Output Summary (Last 24 hours) at 11/27/2020 1257 Last data filed at 11/27/2020 1045 Gross per 24 hour  Intake 718 ml  Output 1100 ml  Net -382 ml   Filed Weights   11/24/20 2156  Weight: 51.4 kg    Exam:  GEN: NAD SKIN: Warm and dry EYES: No pallor or icterus ENT: MMM CV: RRR PULM: CTA B ABD: soft, ND, NT, +BS CNS: Alert and oriented to person  only.  No focal deficits. EXT: No edema or tenderness         Data Reviewed:   I have personally reviewed following labs and imaging studies:  Labs: Labs show the following:   Basic Metabolic Panel: Recent Labs  Lab 11/24/20 0938 11/25/20 0924 11/26/20 0431 11/27/20 0612  NA 128* 131* 132* 132*  K 4.0 3.2* 3.8 3.6  CL 94* 97* 99 96*  CO2 27 26 25 27   GLUCOSE 137* 148* 129* 145*  BUN 9 7* 7* 9  CREATININE 0.61 0.46 0.43* 0.46  CALCIUM 8.8* 8.9 8.9 9.3  MG  --  1.8  --   --   PHOS  --  2.4* 2.4* 3.4   GFR Estimated Creatinine Clearance: 46.6 mL/min (by C-G formula based on SCr of 0.46 mg/dL). Liver Function Tests: No results for input(s): AST, ALT, ALKPHOS, BILITOT, PROT, ALBUMIN in the last 168 hours. No results for input(s): LIPASE, AMYLASE in the last 168 hours. No results for input(s): AMMONIA in the last 168 hours. Coagulation profile No results for input(s): INR, PROTIME in the last 168 hours.  CBC: Recent Labs  Lab 11/24/20 0938  WBC 10.4  HGB 14.2  HCT 38.9  MCV 93.7  PLT 340   Cardiac Enzymes: No results for input(s): CKTOTAL, CKMB, CKMBINDEX, TROPONINI in the last 168 hours. BNP (last 3 results) No results for input(s): PROBNP in the last 8760 hours. CBG: No results for input(s): GLUCAP in the last 168 hours. D-Dimer: No results for input(s): DDIMER in the last 72 hours. Hgb A1c: No results for input(s): HGBA1C in the last 72 hours. Lipid Profile: No results for input(s): CHOL, HDL, LDLCALC, TRIG, CHOLHDL, LDLDIRECT in the last 72 hours. Thyroid function studies: No results for input(s): TSH, T4TOTAL, T3FREE, THYROIDAB in the last 72 hours.  Invalid input(s): FREET3 Anemia work up: Recent Labs    11/27/20 0612  VITAMINB12 1,130*   Sepsis Labs: Recent Labs  Lab 11/24/20 0938  WBC 10.4    Microbiology Recent Results (from the past 240 hour(s))  Resp Panel by RT-PCR (Flu A&B, Covid)     Status: None   Collection Time: 11/24/20   3:59 PM   Specimen: Nasopharyngeal(NP) swabs in vial transport medium  Result Value Ref Range Status   SARS Coronavirus 2 by RT PCR  NEGATIVE NEGATIVE Final    Comment: (NOTE) SARS-CoV-2 target nucleic acids are NOT DETECTED.  The SARS-CoV-2 RNA is generally detectable in upper respiratory specimens during the acute phase of infection. The lowest concentration of SARS-CoV-2 viral copies this assay can detect is 138 copies/mL. A negative result does not preclude SARS-Cov-2 infection and should not be used as the sole basis for treatment or other patient management decisions. A negative result may occur with  improper specimen collection/handling, submission of specimen other than nasopharyngeal swab, presence of viral mutation(s) within the areas targeted by this assay, and inadequate number of viral copies(<138 copies/mL). A negative result must be combined with clinical observations, patient history, and epidemiological information. The expected result is Negative.  Fact Sheet for Patients:  BloggerCourse.com  Fact Sheet for Healthcare Providers:  SeriousBroker.it  This test is no t yet approved or cleared by the [place] FDA and  has been authorized for detection and/or diagnosis of SARS-CoV-2 by FDA under an Emergency Use Authorization (EUA). This EUA will remain  in effect (meaning this test can be used) for the duration of the COVID-19 declaration under Section 564(b)(1) of the Act, 21 U.S.C.section 360bbb-3(b)(1), unless the authorization is terminated  or revoked sooner.       Influenza A by PCR NEGATIVE NEGATIVE Final   Influenza B by PCR NEGATIVE NEGATIVE Final    Comment: (NOTE) The Xpert Xpress SARS-CoV-2/FLU/RSV plus assay is intended as an aid in the diagnosis of influenza from Nasopharyngeal swab specimens and should not be used as a sole basis for treatment. Nasal washings and aspirates are unacceptable for  Xpert Xpress SARS-CoV-2/FLU/RSV testing.  Fact Sheet for Patients: BloggerCourse.com  Fact Sheet for Healthcare Providers: SeriousBroker.it  This test is not yet approved or cleared by the [place] FDA and has been authorized for detection and/or diagnosis of SARS-CoV-2 by FDA under an Emergency Use Authorization (EUA). This EUA will remain in effect (meaning this test can be used) for the duration of the COVID-19 declaration under Section 564(b)(1) of the Act, 21 U.S.C. section 360bbb-3(b)(1), unless the authorization is terminated or revoked.  Performed at [facility], [address] [place], [place] [address]   Urine Culture     Status: Abnormal   Collection Time: 11/24/20  3:59 PM   Specimen: Urine, Random  Result Value Ref Range Status   Specimen Description   Final    URINE, RANDOM Performed at [facility], [address] [place], [place] [address]    Special Requests   Final    NONE Performed at [facility], [address] [place], [place] [address]    Culture MULTIPLE SPECIES PRESENT, SUGGEST RECOLLECTION (A)  Final   Report Status 11/26/2020 FINAL  Final    Procedures and diagnostic studies:  No results found.             LOS: 2 days   Delayne Sanzo  [facility]   Pager on [email or web address]. If 7PM-7AM, please contact night-coverage at [email or web address]     11/27/2020, 12:57 PM

## 2020-11-27 NOTE — Progress Notes (Signed)
Verbal conversation with the pt's spouse in Room 111 at bedside; he indicated that he did not want the pt to receive any PRN Haldol; he wanted what was given to her on 11/26/20 on night shift to get out of her system so that she can wake up; he verbalized that once she is awake and alert he will be able to take her home; he previously spoke with [facility] representative about home health and need for durable medical equipment; Dr Myriam Forehand wrote orders to start the pt on Seroquel this evening; he asked questions about this medication and I printed Education papers in [facility] on Seroquel and gave it to the pt's spouse to read over, he said he would advise me if he wanted staff to give Seroquel to her at the evening doses scheduled; he verbalized questions about Dementia and I printed Education information on Dementia and gave it to him to read over too.  He also said that Dr Myriam Forehand said she was medically stable and could be discharged home today.  Again, he verbalized that he did not want to take her home until she was awake.  I advised the pt's spouse that I would carry his concerns and questions to Dr Myriam Forehand.

## 2020-11-27 NOTE — TOC Progression Note (Addendum)
Transition of Care [facility]) - Progression Note    Patient Details  Name: Kristina Watkins MRN: [id number] Date of Birth: May 14, 1945  [facility]) CM/SW Contact  Kristina Royalty Lutricia Feil, RN Phone Number: 11/27/2020, 10:17 AM  Clinical Narrative:    RN attempted communication with the pt and the spouse Kristina Watkins) however unsuccessful. RN able to leave HIPAA approved message with the son. Currently awaiting cb. Will further engage at that time for possible SNF options.  Addendum: Received a call back much discussion on SNF level of care and HHealth. Pt receptive to any HHealth agency with no out of pocket expense for the pt. RN called Kristina Watkins) denied, Kristina Watkins) denied, Kristina Watkins) and Kristina Watkins) both LVM awaiting call back. Recommended 3-1 and RW however spouse reluctant to received. Offered agency [employer] Kristina Watkins) to speak with spouse on possible out-of-pocket expense which will be 20 %. [employer] will follow up with this RN on the decision made for DME. If not spouse has indicated he would obtain the DME at a local store. RN also offered out pt PT services however indecisive and did not wish to commit at this time. Aware pt can make this request with her primary care provider (Kristina Watkins).  TOC team will continue to address discharge planning for SNF placement.    Barriers to Discharge: Barriers Resolved  Expected Discharge Plan and Services                                     HH Arranged:  (.) HH Agency:  (.) Date HH Agency Contacted:  (ERROR in info) Time HH Agency Contacted: 1008 Representative spoke with at [facility] Agency: .   Social Determinants of Health (SDOH) Interventions    Readmission Risk Interventions No flowsheet data found.

## 2020-11-27 NOTE — Progress Notes (Signed)
Dr Myriam Forehand discontinued the discharge orders previously written in [facility] at 11:30.  At 12:44 I advised Dr Myriam Forehand of the pt's spouse's concerns (see separate note at 10:45)

## 2020-11-28 DIAGNOSIS — G301 Alzheimer's disease with late onset: Secondary | ICD-10-CM

## 2020-11-28 DIAGNOSIS — F0281 Dementia in other diseases classified elsewhere with behavioral disturbance: Secondary | ICD-10-CM

## 2020-11-28 NOTE — Plan of Care (Signed)
Patient oriented only to self, continues to remove clothing and purewick frequently. Unable to reorient to hospital and situation. Adequate UO, BM this shift, incontinence care provided. Very little sleep overnight, lights dimmed to attempt to not disturb sleep/wake cycle. Fall/safety precautions in place, rounding performed.

## 2020-11-28 NOTE — TOC Progression Note (Signed)
Transition of Care [facility]) - Progression Note    Patient Details  Name: Kristina Watkins MRN: [id number] Date of Birth: Feb 11, 1946  [facility]) CM/SW Contact  Allayne Butcher, RN Phone Number: 11/28/2020, 3:45 PM  Clinical Narrative:    Patient not discharging today.  TOC will follow up tomorrow with MD and family.      Barriers to Discharge: Barriers Resolved  Expected Discharge Plan and Services           Expected Discharge Date: 11/28/20               DME Arranged: N/A DME Agency: NA       HH Arranged: PT, OT HH Agency: [facility] Date HH Agency Contacted: 11/28/20 Time HH Agency Contacted: 1046 Representative spoke with at [facility] Agency: Barbara Cower   Social Determinants of Health (SDOH) Interventions    Readmission Risk Interventions No flowsheet data found.

## 2020-11-28 NOTE — TOC Transition Note (Addendum)
Transition of Care [facility]) - CM/SW Discharge Note   Patient Details  Name: Kristina Watkins MRN: [id number] Date of Birth: [birth year]  Transition [facility] Care [facility]) CM/SW Contact:  Allayne Butcher, RN Phone Number: 11/28/2020, 10:46 AM   Clinical Narrative:    Patient medically cleared for discharge home today.  Patient's husband at the bedside this morning, patient sleeping soundly.  Husband, Kristina Watkins says he will come back this evening around 5 or 6 to take patient home since she is so drowsy now.  He reports Kristina Watkins is to be delivered to the home today and bedside commode.  He agrees with [facility] through [facility], PCP is Dr. Hillery Watkins at [facility].    Briefly discussed SNF for short term rehab but Kristina Watkins thinks that she will be good at home.     Final next level of care: Home w Home Health Services Barriers to Discharge: Barriers Resolved   Patient Goals and CMS Choice   CMS Medicare.gov Compare Post Acute Care list provided to:: Patient Choice offered to / list presented to : Spouse  Discharge Placement                    Patient and family notified of of transfer:  (.)  Discharge Plan and Services                DME Arranged: N/A DME Agency: NA       HH Arranged: PT, OT HH Agency: [facility] Date HH Agency Contacted: 11/28/20 Time HH Agency Contacted: 1046 Representative spoke with at [facility] Agency: Barbara Cower  Social Determinants of Health (SDOH) Interventions     Readmission Risk Interventions No flowsheet data found.

## 2020-11-28 NOTE — Progress Notes (Addendum)
PT Cancellation Note  Patient Details Name: Kristina Watkins MRN: [id number] DOB: 1945/04/29   Cancelled Treatment:    Reason Eval/Treat Not Completed: Fatigue/lethargy limiting ability to participate  Discharge planned for today than cancelled.  Went to see pt and she is asleep and does not awaken to voice/tactile cues.  Will defer and continue at a later time/date.  Nursing reported lethargy earlier today too so she was left to sleep.   Danielle Dess 11/28/2020, 4:08 PM

## 2020-11-28 NOTE — Progress Notes (Addendum)
Progress Note    Kristina Watkins  WNU:[id number] DOB: 1945-05-25  DOA: 11/24/2020 PCP: Pcp, No      Brief Narrative:    Medical records reviewed and are as summarized below:  Kristina Watkins is a 75 y.o. female with past medical history significant for advanced dementia who was brought to the hospital because of multiple falls at home.  According to husband, patient has become progressively weaker and has not been eating well.  He said he gave him donepezil but he noticed that patient had become more "delusional" so he stopped it after 3 days.      Assessment/Plan:   Principal Problem:   Acute metabolic encephalopathy Active Problems:   AMS (altered mental status)   Dementia with behavioral disturbance (HCC)   Essential hypertension   Hyponatremia    Body mass index is 20.73 kg/m.   Hyponatremia: Improved.  Acute metabolic encephalopathy superimposed on underlying dementia: Patient likely has progressive dementia as well.  Continue supportive care.  Her husband does not want her to have any Haldol or Seroquel.    Hypokalemia and hypophosphatemia: Improved  Hypertension: Continue amlodipine  Multiple falls at home/debility: PT recommends discharge to SNF.  Husband prefers to take her home.  However, it seems unsafe to discharge patient home at this time.  Patient will be reevaluated tomorrow for possible discharge to home.     Diet Order             Diet - low sodium heart healthy           DIET DYS 3 Room service appropriate? No; Fluid consistency: Thin  Diet effective now                      Consultants: None  Procedures: None    Medications:    amLODipine  5 mg Oral Daily   enoxaparin (LOVENOX) injection  40 mg Subcutaneous Q24H   feeding supplement  237 mL Oral BID BM   multivitamin with minerals  1 tablet Oral Daily   Continuous Infusions:     Anti-infectives (From admission, onward)    Start     Dose/Rate Route  Frequency Ordered Stop   11/24/20 1715  cefTRIAXone (ROCEPHIN) 1 g in sodium chloride 0.9 % 100 mL IVPB        1 g 200 mL/hr over 30 Minutes Intravenous  Once 11/24/20 1703 11/24/20 1839              Family Communication/Anticipated D/C date and plan/Code Status   DVT prophylaxis: enoxaparin (LOVENOX) injection 40 mg Start: 11/24/20 1645     Code Status: Full Code  Family Communication: Husband at the bedside Disposition Plan:    Status is: Observation  The patient will require care spanning > 2 midnights and should be moved to inpatient because: IV treatments appropriate due to intensity of illness or inability to take PO and Inpatient level of care appropriate due to severity of illness  Dispo: The patient is from: Home              Anticipated d/c is to: SNF              Patient currently is not medically stable to d/c.   Difficult to place patient No           Subjective:   Interval events noted.  He has been was at the bedside.  She was alert but confused at the time  of my visit.  However, later in the day, nurse reported patient was sleepy/lethargic.  Of note, she did not receive any sedating medications overnight.  Objective:    Vitals:   11/28/20 0038 11/28/20 0510 11/28/20 0804 11/28/20 1257  BP: (!) 152/96 (!) 142/79 121/80 (!) 141/86  Pulse: 99 100 91 90  Resp: 16 16 16 16   Temp: 98.5 F (36.9 C) 98.9 F (37.2 C) 98.4 F (36.9 C) 98 F (36.7 C)  TempSrc: Oral Oral Oral   SpO2: 96% 95% 94% 96%  Weight:      Height:       No data found.   Intake/Output Summary (Last 24 hours) at 11/28/2020 1645 Last data filed at 11/27/2020 1945 Gross per 24 hour  Intake 100 ml  Output 625 ml  Net -525 ml   Filed Weights   11/24/20 2156  Weight: 51.4 kg    Exam:  GEN: NAD SKIN: Warm and dry EYES: No pallor or icterus ENT: MMM CV: RRR PULM: CTA B ABD: soft, ND, NT, +BS CNS: Alert and oriented x1, confused, non focal EXT: No edema or  tenderness         Data Reviewed:   I have personally reviewed following labs and imaging studies:  Labs: Labs show the following:   Basic Metabolic Panel: Recent Labs  Lab 11/24/20 0938 11/25/20 0924 11/26/20 0431 11/27/20 0612  NA 128* 131* 132* 132*  K 4.0 3.2* 3.8 3.6  CL 94* 97* 99 96*  CO2 27 26 25 27   GLUCOSE 137* 148* 129* 145*  BUN 9 7* 7* 9  CREATININE 0.61 0.46 0.43* 0.46  CALCIUM 8.8* 8.9 8.9 9.3  MG  --  1.8  --   --   PHOS  --  2.4* 2.4* 3.4   GFR Estimated Creatinine Clearance: 46.6 mL/min (by C-G formula based on SCr of 0.46 mg/dL). Liver Function Tests: No results for input(s): AST, ALT, ALKPHOS, BILITOT, PROT, ALBUMIN in the last 168 hours. No results for input(s): LIPASE, AMYLASE in the last 168 hours. No results for input(s): AMMONIA in the last 168 hours. Coagulation profile No results for input(s): INR, PROTIME in the last 168 hours.  CBC: Recent Labs  Lab 11/24/20 0938  WBC 10.4  HGB 14.2  HCT 38.9  MCV 93.7  PLT 340   Cardiac Enzymes: No results for input(s): CKTOTAL, CKMB, CKMBINDEX, TROPONINI in the last 168 hours. BNP (last 3 results) No results for input(s): PROBNP in the last 8760 hours. CBG: No results for input(s): GLUCAP in the last 168 hours. D-Dimer: No results for input(s): DDIMER in the last 72 hours. Hgb A1c: No results for input(s): HGBA1C in the last 72 hours. Lipid Profile: No results for input(s): CHOL, HDL, LDLCALC, TRIG, CHOLHDL, LDLDIRECT in the last 72 hours. Thyroid function studies: No results for input(s): TSH, T4TOTAL, T3FREE, THYROIDAB in the last 72 hours.  Invalid input(s): FREET3 Anemia work up: Recent Labs    11/27/20 0612  VITAMINB12 1,130*   Sepsis Labs: Recent Labs  Lab 11/24/20 0938  WBC 10.4    Microbiology Recent Results (from the past 240 hour(s))  Resp Panel by RT-PCR (Flu A&B, Covid)     Status: None   Collection Time: 11/24/20  3:59 PM   Specimen: Nasopharyngeal(NP)  swabs in vial transport medium  Result Value Ref Range Status   SARS Coronavirus 2 by RT PCR NEGATIVE NEGATIVE Final    Comment: (NOTE) SARS-CoV-2 target nucleic acids are NOT DETECTED.  The SARS-CoV-2 RNA is generally detectable in upper respiratory specimens during the acute phase of infection. The lowest concentration of SARS-CoV-2 viral copies this assay can detect is 138 copies/mL. A negative result does not preclude SARS-Cov-2 infection and should not be used as the sole basis for treatment or other patient management decisions. A negative result may occur with  improper specimen collection/handling, submission of specimen other than nasopharyngeal swab, presence of viral mutation(s) within the areas targeted by this assay, and inadequate number of viral copies(<138 copies/mL). A negative result must be combined with clinical observations, patient history, and epidemiological information. The expected result is Negative.  Fact Sheet for Patients:  BloggerCourse.com  Fact Sheet for Healthcare Providers:  SeriousBroker.it  This test is no t yet approved or cleared by the [place] FDA and  has been authorized for detection and/or diagnosis of SARS-CoV-2 by FDA under an Emergency Use Authorization (EUA). This EUA will remain  in effect (meaning this test can be used) for the duration of the COVID-19 declaration under Section 564(b)(1) of the Act, 21 U.S.C.section 360bbb-3(b)(1), unless the authorization is terminated  or revoked sooner.       Influenza A by PCR NEGATIVE NEGATIVE Final   Influenza B by PCR NEGATIVE NEGATIVE Final    Comment: (NOTE) The Xpert Xpress SARS-CoV-2/FLU/RSV plus assay is intended as an aid in the diagnosis of influenza from Nasopharyngeal swab specimens and should not be used as a sole basis for treatment. Nasal washings and aspirates are unacceptable for Xpert Xpress  SARS-CoV-2/FLU/RSV testing.  Fact Sheet for Patients: BloggerCourse.com  Fact Sheet for Healthcare Providers: SeriousBroker.it  This test is not yet approved or cleared by the [place] FDA and has been authorized for detection and/or diagnosis of SARS-CoV-2 by FDA under an Emergency Use Authorization (EUA). This EUA will remain in effect (meaning this test can be used) for the duration of the COVID-19 declaration under Section 564(b)(1) of the Act, 21 U.S.C. section 360bbb-3(b)(1), unless the authorization is terminated or revoked.  Performed at [facility], [address] [place], [place] [address]   Urine Culture     Status: Abnormal   Collection Time: 11/24/20  3:59 PM   Specimen: Urine, Random  Result Value Ref Range Status   Specimen Description   Final    URINE, RANDOM Performed at [facility], [address], [place], [place] [address]    Special Requests   Final    NONE Performed at [facility], [address] [place], [place] [address]    Culture MULTIPLE SPECIES PRESENT, SUGGEST RECOLLECTION (A)  Final   Report Status 11/26/2020 FINAL  Final    Procedures and diagnostic studies:  No results found.             LOS: 3 days   Abagael Kramm  [facility]   Pager on [email or web address]. If 7PM-7AM, please contact night-coverage at [email or web address]     11/28/2020, 4:45 PM

## 2020-11-28 NOTE — Care Management Important Message (Signed)
Important Message  Patient Details  Name: Kristina Watkins MRN: [id number] Date of Birth: [birth year]   Medicare Important Message Given:  N/A - LOS <3 / Initial given by admissions  Initial Medicare IM reviewed with Lahoma Crocker, spouse, by Bascom Levels, Patient Access Associate on 11/27/2020 at 12:55pm.    Johnell Comings 11/28/2020, 8:46 AM

## 2020-11-29 LAB — BASIC METABOLIC PANEL
Anion gap: 9 (ref 5–15)
BUN: 18 mg/dL (ref 8–23)
CO2: 27 mmol/L (ref 22–32)
Calcium: 9.4 mg/dL (ref 8.9–10.3)
Chloride: 100 mmol/L (ref 98–111)
Creatinine, Ser: 0.72 mg/dL (ref 0.44–1.00)
GFR, Estimated: >60 mL/min (ref >60)
Glucose, Bld: 138 mg/dL — ABNORMAL HIGH (ref 70–99)
Potassium: 3.7 mmol/L (ref 3.5–5.1)
Sodium: 136 mmol/L (ref 135–145)

## 2020-11-29 NOTE — Progress Notes (Signed)
Physical Therapy Treatment Patient Details Name: Kristina Watkins MRN: [id number] DOB: 1945/06/02 Today's Date: 11/29/2020   History of Present Illness Kristina Watkins is a 75 y.o. female with past medical history significant for advanced dementia who was brought to the hospital because of multiple falls at home.  According to husband, patient has become progressively weaker and has not been eating well    PT Comments    Pt awake in bed.  Found to have removed gown.  Assisted with putting gown back on.  She is able to get to EOB with increased time and cues.  Min/mod a at times but assist seems to be more due to cognition than weakness.  She is steady in sitting.  She is able to stand with min assist and walk to/from door with RW and very short shuffling steps.  She does have more difficulty advancing RLE today despite cues.  Strength seems good and she is able to raise it high when asked but seems to have difficulty following cues to place it in front of her.  She returns to bed with min a x 1.  SNF remains appropriate for discharge but per notes, husband has chosen to return home with pt.  She continues to need an increased level of care then her baseline and seems to have more difficulty walking than PLOF.  Husband was not here for session today.  Last session she responded well to him and while she was cooperative today she may have done better if he was in attendance.  If discharge home, she will benefit from continued HHPT and OT services.  She will benefit from a RW and 3-in 1 commode along with a wheelchair for mobility.  She has limited gait distances and given significant decreased speed, household mobility would be cumbersome and fatiguing for both pt and husband.      Patient suffers from advanced dementia which impairs his/her ability to perform daily activities like toileting, feeding, dressing, grooming, bathing in the home. A cane, walker, crutch will not resolve the patient's issue  with performing activities of daily living. A lightweight wheelchair and cushion is required/recommended and will allow patient to safely perform daily activities.   Patient can safely propel the wheelchair in the home or has a caregiver who can provide assistance.   Will update MD and TOC regarding session as requested.   Recommendations for follow up therapy are one component of a multi-disciplinary discharge planning process, led by the attending physician.  Recommendations may be updated based on patient status, additional functional criteria and insurance authorization.  Follow Up Recommendations  SNF;Other (comment)     Equipment Recommendations  Rolling walker with 5" wheels;3in1 (PT);Wheelchair (measurements PT);Wheelchair cushion (measurements PT)    Recommendations for Other Services       Precautions / Restrictions Precautions Precautions: Fall Restrictions Weight Bearing Restrictions: No     Mobility  Bed Mobility Overal bed mobility: Needs Assistance Bed Mobility: Supine to Sit;Sit to Supine Rolling: Min assist;Mod assist   Supine to sit: Min assist     General bed mobility comments: increased time and cues.  Difficulty seems more due to cognition vs weakness    Transfers Overall transfer level: Needs assistance Equipment used: Rolling walker (2 wheeled) Transfers: Sit to/from Stand Sit to Stand: Min assist            Ambulation/Gait Ambulation/Gait assistance: Min Chemical engineer (Feet): 20 Feet Assistive device: Rolling walker (2 wheeled) Gait Pattern/deviations: Step-through pattern;Decreased step length -  right;Decreased step length - left;Narrow base of support Gait velocity: decreased   General Gait Details: verys hort shuggling steps.  some difficulty advancing RLE vs LLE but strength seems good.   Stairs             Wheelchair Mobility    Modified Rankin (Stroke Patients Only)       Balance Overall balance assessment:  Needs assistance Sitting-balance support: Bilateral upper extremity supported;Feet supported Sitting balance-Leahy Scale: Good     Standing balance support: Bilateral upper extremity supported Standing balance-Leahy Scale: Poor Standing balance comment: walker for balance but no buckling, generally unsteady and hands on assist at all times.                            Cognition Arousal/Alertness: Awake/alert Behavior During Therapy: WFL for tasks assessed/performed Overall Cognitive Status: History of cognitive impairments - at baseline                                 General Comments: remains confused but awake and more cooperative today      Exercises      General Comments        Pertinent Vitals/Pain Pain Assessment: Faces Faces Pain Scale: Hurts a little bit Pain Location: low back and right hip Pain Descriptors / Indicators: Grimacing;Guarding;Moaning Pain Intervention(s): Limited activity within patient's tolerance;Monitored during session;Repositioned    Home Living                      Prior Function            PT Goals (current goals can now be found in the care plan section) Progress towards PT goals: Progressing toward goals    Frequency    Min 2X/week      PT Plan Current plan remains appropriate    Co-evaluation              AM-PAC PT "6 Clicks" Mobility   Outcome Measure  Help needed turning from your back to your side while in a flat bed without using bedrails?: A Little Help needed moving from lying on your back to sitting on the side of a flat bed without using bedrails?: A Little Help needed moving to and from a bed to a chair (including a wheelchair)?: A Little Help needed standing up from a chair using your arms (e.g., wheelchair or bedside chair)?: A Little Help needed to walk in hospital room?: A Lot Help needed climbing 3-5 steps with a railing? : A Lot 6 Click Score: 16    End of Session  Equipment Utilized During Treatment: Gait belt Activity Tolerance: Patient tolerated treatment well Patient left: in bed;with call bell/phone within reach;with bed alarm set Nurse Communication: Mobility status PT Visit Diagnosis: Unsteadiness on feet (R26.81);Other abnormalities of gait and mobility (R26.89);Repeated falls (R29.6);Muscle weakness (generalized) (M62.81);Difficulty in walking, not elsewhere classified (R26.2)     Time: 0115-0139 PT Time Calculation (min) (ACUTE ONLY): 24 min  Charges:  $Gait Training: 23-37 mins                   Danielle Dess, PTA 11/29/20, 1:47 PM

## 2020-11-29 NOTE — Progress Notes (Signed)
OT Cancellation Note  Patient Details Name: Kristina Watkins MRN: [id number] DOB: 1946/01/01   Cancelled Treatment:    Reason Eval/Treat Not Completed: Other (comment). Upon attempt. Pt sleeping. Spouse came out of room and spoke with nurse tech, requesting pt to sleep at this time. Will re-attempt OT tx at later date/time as pt is more alert to support optimal participation.   Arman Filter., MPH, MS, OTR/L ascom [phone] 11/29/20, 9:48 AM

## 2020-11-29 NOTE — TOC Transition Note (Signed)
Transition of Care [facility]) - CM/SW Discharge Note   Patient Details  Name: Kristina Watkins MRN: [id number] Date of Birth: [birth year]  [facility] CM/SW Contact:  Allayne Butcher, RN Phone Number: 11/29/2020, 3:48 PM   Clinical Narrative:    Patient's husband wants to take her home today.  He also wants to get the 3 in 1 and RW for home.  Jermaine with [employer] will have the equipment delivered.  RNCM messaged MD about discharge.  [facility]    Final next level of care: Home w Home Health Services Barriers to Discharge: Barriers Resolved   Patient Goals and CMS Choice Patient states their goals for this hospitalization and ongoing recovery are:: patient wants to come and pick patient up and take her home CMS Medicare.gov Compare Post Acute Care list provided to:: Patient Represenative (must comment) Choice offered to / list presented to : Spouse  Discharge Placement                    Patient and family notified of of transfer:  (.)  Discharge Plan and Services                DME Arranged: 3-N-1, Walker rolling DME Agency: [employer] Date DME Agency Contacted: 11/29/20 Time DME Agency Contacted: 1547 Representative spoke with at DME Agency: Vaughan Basta HH Arranged: PT, OT HH Agency: [facility] Date HH Agency Contacted: 11/29/20 Time HH Agency Contacted: 1548 Representative spoke with at [facility] Agency: Barbara Cower  Social Determinants of Health (SDOH) Interventions     Readmission Risk Interventions No flowsheet data found.

## 2020-11-29 NOTE — Discharge Summary (Signed)
Physician Discharge Summary  Kristina Watkins KGU:[id number] DOB: 05-25-1945 DOA: 11/24/2020  PCP: Pcp, No  Admit date: 11/24/2020 Discharge date: 11/29/2020  Discharge disposition: Home with home health therapy   Recommendations for Outpatient Follow-Up:   Follow-up with PCP in 1 week   Discharge Diagnosis:   Principal Problem:   Acute metabolic encephalopathy Active Problems:   AMS (altered mental status)   Dementia with behavioral disturbance (HCC)   Essential hypertension   Hyponatremia    Discharge Condition: Stable.  Diet recommendation:  Diet Order             Diet - low sodium heart healthy           DIET DYS 3 Room service appropriate? No; Fluid consistency: Thin  Diet effective now                     Code Status: Full Code     Hospital Course:   Ms. Kristina Watkins is a 75 y.o. female with past medical history significant for advanced dementia who was brought to the hospital because of multiple falls at home.  According to husband, patient has become progressively weaker and has not been eating well.  He said he gave him donepezil but he noticed that patient had become more "delusional" so he stopped it after 3 days.  She was admitted to the hospital for acute hyponatremia and acute metabolic encephalopathy superimposed on underlying dementia.  She was treated with IV fluids.  She had hypokalemia and hypophosphatemia that were repleted as well.  It appears patient has worsening dementia and is becoming increasingly difficult for her husband to take care of her at home.  She was evaluated by PT and OT who recommended further rehabilitation at a skilled nursing facility.  Her husband was initially undecided about disposition to SNF versus home.  Eventually, he decided that he would take his wife home.  He understands that patient is at risk for falls.  He said he would take the patient to a "day program" during the day so that he can go about his normal  daily activities.  Patient is deemed medically stable for discharge to home today.  She was able to work with PT today.  She will go home with home health therapy.     Discharge Exam:    Vitals:   11/29/20 0142 11/29/20 0445 11/29/20 0746 11/29/20 1112  BP: 126/85 131/77 124/63 (!) 144/78  Pulse: 92 83 81 89  Resp: 18 18 16 18   Temp: 99 F (37.2 C) 97.8 F (36.6 C) 97.8 F (36.6 C) 98.3 F (36.8 C)  TempSrc: Oral  Oral Oral  SpO2: 98% 96% 96% 98%  Weight:      Height:         GEN: NAD SKIN: Warm and dry EYES: EOMI ENT: MMM CV: RRR PULM: CTA B ABD: soft, ND, NT, +BS CNS: Alert and oriented to person only,, non focal EXT: No edema or tenderness   The results of significant diagnostics from this hospitalization (including imaging, microbiology, ancillary and laboratory) are listed below for reference.     Procedures and Diagnostic Studies:   CT Head Wo Contrast  Result Date: 11/24/2020 CLINICAL DATA:  Fall EXAM: CT HEAD WITHOUT CONTRAST TECHNIQUE: Contiguous axial images were obtained from the base of the skull through the vertex without intravenous contrast. COMPARISON:  None. FINDINGS: Brain: There is no evidence of acute intracranial hemorrhage, extra-axial fluid collection, or acute infarct. There  is moderate parenchymal volume loss with disproportionate hippocampal atrophy. Foci of hypodensity in the subcortical and periventricular white matter likely reflect sequela of moderate chronic white matter microangiopathy. There is no mass lesion. There is no midline shift. Vascular: No hyperdense vessel or unexpected calcification. Skull: Normal. Negative for fracture or focal lesion. Sinuses/Orbits: The imaged paranasal sinuses are clear. The globes and orbits are unremarkable. Other: None. IMPRESSION: 1. No acute intracranial pathology. 2. Moderate parenchymal volume loss with disproportionate hippocampal atrophy and moderate chronic white matter microangiopathy.  Electronically Signed   By: Lesia Hausen M.D.   On: 11/24/2020 14:05   DG Hip Unilat With Pelvis 2-3 Views Left  Result Date: 11/24/2020 CLINICAL DATA:  Left hip pain after fall EXAM: DG HIP (WITH OR WITHOUT PELVIS) 2-3V LEFT COMPARISON:  None. FINDINGS: There is no evidence of hip fracture or dislocation. Sacrum and portions of the bilateral iliac bones can not be evaluated due to overlying bowel gas. There is no evidence of arthropathy or other focal bone abnormality. IMPRESSION: No evidence of displaced hip fracture. Electronically Signed   By: Allegra Lai M.D.   On: 11/24/2020 14:03     Labs:   Basic Metabolic Panel: Recent Labs  Lab 11/24/20 0938 11/25/20 0924 11/26/20 0431 11/27/20 0612 11/29/20 0532  NA 128* 131* 132* 132* 136  K 4.0 3.2* 3.8 3.6 3.7  CL 94* 97* 99 96* 100  CO2 27 26 25 27 27   GLUCOSE 137* 148* 129* 145* 138*  BUN 9 7* 7* 9 18  CREATININE 0.61 0.46 0.43* 0.46 0.72  CALCIUM 8.8* 8.9 8.9 9.3 9.4  MG  --  1.8  --   --   --   PHOS  --  2.4* 2.4* 3.4  --    GFR Estimated Creatinine Clearance: 46.6 mL/min (by C-G formula based on SCr of 0.72 mg/dL). Liver Function Tests: No results for input(s): AST, ALT, ALKPHOS, BILITOT, PROT, ALBUMIN in the last 168 hours. No results for input(s): LIPASE, AMYLASE in the last 168 hours. No results for input(s): AMMONIA in the last 168 hours. Coagulation profile No results for input(s): INR, PROTIME in the last 168 hours.  CBC: Recent Labs  Lab 11/24/20 0938  WBC 10.4  HGB 14.2  HCT 38.9  MCV 93.7  PLT 340   Cardiac Enzymes: No results for input(s): CKTOTAL, CKMB, CKMBINDEX, TROPONINI in the last 168 hours. BNP: Invalid input(s): POCBNP CBG: No results for input(s): GLUCAP in the last 168 hours. D-Dimer No results for input(s): DDIMER in the last 72 hours. Hgb A1c No results for input(s): HGBA1C in the last 72 hours. Lipid Profile No results for input(s): CHOL, HDL, LDLCALC, TRIG, CHOLHDL, LDLDIRECT  in the last 72 hours. Thyroid function studies No results for input(s): TSH, T4TOTAL, T3FREE, THYROIDAB in the last 72 hours.  Invalid input(s): FREET3 Anemia work up 11/26/20    11/27/20 0612  11/29/20 1,130*   Microbiology Recent Results (from the past 240 hour(s))  Resp Panel by RT-PCR (Flu A&B, Covid)     Status: None   Collection Time: 11/24/20  3:59 PM   Specimen: Nasopharyngeal(NP) swabs in vial transport medium  Result Value Ref Range Status   SARS Coronavirus 2 by RT PCR NEGATIVE NEGATIVE Final    Comment: (NOTE) SARS-CoV-2 target nucleic acids are NOT DETECTED.  The SARS-CoV-2 RNA is generally detectable in upper respiratory specimens during the acute phase of infection. The lowest concentration of SARS-CoV-2 viral copies this assay can detect is  138 copies/mL. A negative result does not preclude SARS-Cov-2 infection and should not be used as the sole basis for treatment or other patient management decisions. A negative result may occur with  improper specimen collection/handling, submission of specimen other than nasopharyngeal swab, presence of viral mutation(s) within the areas targeted by this assay, and inadequate number of viral copies(<138 copies/mL). A negative result must be combined with clinical observations, patient history, and epidemiological information. The expected result is Negative.  Fact Sheet for Patients:  BloggerCourse.com  Fact Sheet for Healthcare Providers:  SeriousBroker.it  This test is no t yet approved or cleared by the [place] FDA and  has been authorized for detection and/or diagnosis of SARS-CoV-2 by FDA under an Emergency Use Authorization (EUA). This EUA will remain  in effect (meaning this test can be used) for the duration of the COVID-19 declaration under Section 564(b)(1) of the Act, 21 U.S.C.section 360bbb-3(b)(1), unless the authorization is terminated  or  revoked sooner.       Influenza A by PCR NEGATIVE NEGATIVE Final   Influenza B by PCR NEGATIVE NEGATIVE Final    Comment: (NOTE) The Xpert Xpress SARS-CoV-2/FLU/RSV plus assay is intended as an aid in the diagnosis of influenza from Nasopharyngeal swab specimens and should not be used as a sole basis for treatment. Nasal washings and aspirates are unacceptable for Xpert Xpress SARS-CoV-2/FLU/RSV testing.  Fact Sheet for Patients: BloggerCourse.com  Fact Sheet for Healthcare Providers: SeriousBroker.it  This test is not yet approved or cleared by the [place] FDA and has been authorized for detection and/or diagnosis of SARS-CoV-2 by FDA under an Emergency Use Authorization (EUA). This EUA will remain in effect (meaning this test can be used) for the duration of the COVID-19 declaration under Section 564(b)(1) of the Act, 21 U.S.C. section 360bbb-3(b)(1), unless the authorization is terminated or revoked.  Performed at [facility], [address] [place], [place] [address]   Urine Culture     Status: Abnormal   Collection Time: 11/24/20  3:59 PM   Specimen: Urine, Random  Result Value Ref Range Status   Specimen Description   Final    URINE, RANDOM Performed at [facility], [address], [place], [place] [address]    Special Requests   Final    NONE Performed at [facility], [address] [place], [place] [address]    Culture MULTIPLE SPECIES PRESENT, SUGGEST RECOLLECTION (A)  Final   Report Status 11/26/2020 FINAL  Final     Discharge Instructions:   Discharge Instructions     Diet - low sodium heart healthy   Complete by: As directed    Discharge instructions   Complete by: As directed    Fall precautions   Face-to-face encounter (required for Medicare/Medicaid patients)   Complete by: As directed    I Zoila Ditullio certify that this patient is under my care and that  I, or a nurse practitioner or physician's assistant working with me, had a face-to-face encounter that meets the physician face-to-face encounter requirements with this patient on 11/29/2020. The encounter with the patient was in whole, or in part for the following medical condition(s) which is the primary reason for home health care (List medical condition): Unsteady gait, dementia, fall   The encounter with the patient was in whole, or in part, for the following medical condition, which is the primary reason for home health care: Unsteady gait, dementia, fall   I certify that, based on my findings,  the following services are medically necessary home health services: Physical therapy   Reason for Medically Necessary Home Health Services: Therapy- Investment banker, operational, Patent examiner   My clinical findings support the need for the above services: Unsafe ambulation due to balance issues   Further, I certify that my clinical findings support that this patient is homebound due to: Mental confusion   For home use only DME 3 n 1   Complete by: As directed    Home Health   Complete by: As directed    To provide the following care/treatments:  PT OT     Increase activity slowly   Complete by: As directed    Walker rolling   Complete by: As directed       Allergies as of 11/29/2020       Reactions   Donepezil Other (See Comments)   Confusion   Penicillins         Medication List     STOP taking these medications    donepezil 5 MG tablet Commonly known as: ARICEPT       TAKE these medications    amLODipine 5 MG tablet Commonly known as: NORVASC Take 1 tablet (5 mg total) by mouth daily.               Durable Medical Equipment  (From admission, onward)           Start     Ordered   11/27/20 0000  For home use only DME 3 n 1        11/27/20 1130               If you experience worsening of your admission symptoms, develop shortness of  breath, life threatening emergency, suicidal or homicidal thoughts you must seek medical attention immediately by calling 911 or calling your MD immediately  if symptoms less severe.   You must read complete instructions/literature along with all the possible adverse reactions/side effects for all the medicines you take and that have been prescribed to you. Take any new medicines after you have completely understood and accept all the possible adverse reactions/side effects.    Please note   You were cared for by a hospitalist during your hospital stay. If you have any questions about your discharge medications or the care you received while you were in the hospital after you are discharged, you can call the unit and asked to speak with the hospitalist on call if the hospitalist that took care of you is not available. Once you are discharged, your primary care physician will handle any further medical issues. Please note that NO REFILLS for any discharge medications will be authorized once you are discharged, as it is imperative that you return to your primary care physician (or establish a relationship with a primary care physician if you do not have one) for your aftercare needs so that they can reassess your need for medications and monitor your lab values.       Time coordinating discharge: 33 minutes  Signed:  Tahira Olivarez  [facility] 11/29/2020, 3:59 PM   Pager on [email or web address]. If 7PM-7AM, please contact night-coverage at [email or web address]

## 2021-03-07 ENCOUNTER — Emergency Department: Payer: Medicare Other

## 2021-03-07 ENCOUNTER — Encounter: Payer: Self-pay | Admitting: Emergency Medicine

## 2021-03-07 ENCOUNTER — Other Ambulatory Visit: Payer: Self-pay

## 2021-03-07 DIAGNOSIS — Y92019 Unspecified place in single-family (private) house as the place of occurrence of the external cause: Secondary | ICD-10-CM | POA: Diagnosis not present

## 2021-03-07 DIAGNOSIS — F0390 Unspecified dementia without behavioral disturbance: Secondary | ICD-10-CM | POA: Diagnosis not present

## 2021-03-07 DIAGNOSIS — L89012 Pressure ulcer of right elbow, stage 2: Secondary | ICD-10-CM | POA: Diagnosis not present

## 2021-03-07 DIAGNOSIS — W07XXXA Fall from chair, initial encounter: Secondary | ICD-10-CM | POA: Insufficient documentation

## 2021-03-07 DIAGNOSIS — S42022A Displaced fracture of shaft of left clavicle, initial encounter for closed fracture: Secondary | ICD-10-CM | POA: Insufficient documentation

## 2021-03-07 DIAGNOSIS — I10 Essential (primary) hypertension: Secondary | ICD-10-CM | POA: Diagnosis not present

## 2021-03-07 DIAGNOSIS — S2241XA Multiple fractures of ribs, right side, initial encounter for closed fracture: Secondary | ICD-10-CM | POA: Diagnosis not present

## 2021-03-07 DIAGNOSIS — S6991XA Unspecified injury of right wrist, hand and finger(s), initial encounter: Secondary | ICD-10-CM | POA: Diagnosis present

## 2021-03-07 NOTE — ED Provider Notes (Signed)
Emergency Medicine Provider Triage Evaluation Note  Kristina Watkins , a 75 y.o. female  was evaluated in triage.  Pt complains of mechanical fall.  Patient with a history of dementia, presents with her husband, who notes that she forgets that she cannot walk.  Patient got up from a chair, and fell hitting her head and had a witnessed episode of LOC.  Patient without any subsequent nausea, vomiting, or head bleed.  Presents with hematoma to the posterior scalp.  There is also hematoma and deformity to the right shoulder anteriorly.  The right elbow is wrapped due to an abrasion.  Review of Systems  Positive: Head injury, right shoulder deformity, right elbow abrasion Negative: NVD  Physical Exam  BP (!) 153/79 (BP Location: Left Arm)    Pulse 81    Temp 97.7 F (36.5 C) (Oral)    Resp 16    Ht 5\' 4"  (1.626 m)    Wt 53.1 kg    SpO2 96%    BMI 20.08 kg/m  Gen:   Awake, no distress  NAD Resp:  Normal effort CTA MSK:   Moves extremities without difficulty Right shoulder deformity Other:  CVS: RRR  Medical Decision Making  Medically screening exam initiated at 10:38 PM.  Appropriate orders placed.  Kristina Watkins was informed that the remainder of the evaluation will be completed by another provider, this initial triage assessment does not replace that evaluation, and the importance of remaining in the ED until their evaluation is complete.  Geriatric patient with dementia at baseline presents to the ED following mechanical fall with witnessed LOC.  Patient is evaluated for complaints in the ED.  She is stable at baseline according to the husband.  She will be evaluated with head and neck CT as well as plain films of the shoulder and elbow.   Kristina Mccoy, PA-C 03/07/21 2240    03/09/21, MD 03/07/21 [phone]

## 2021-03-07 NOTE — ED Triage Notes (Signed)
Pt to ED from home c/o fall tonight.  Husband states has hx of dementia, forgets she can't walk but tried to get up from chair and fell, hitting head and had LOC.  Husband states hit posterior head with hematoma, right shoulder pain and right elbow wrapped.  Denies bleeding.  Pt alert, confused to baseline per husband, chest rise even and unlabored.  Jenise into triage 1 for MSE.

## 2021-03-08 ENCOUNTER — Emergency Department
Admission: EM | Admit: 2021-03-08 | Discharge: 2021-03-08 | Disposition: A | Discharge status: Home / Self Care | Payer: Medicare Other | Attending: Emergency Medicine | Admitting: Emergency Medicine

## 2021-03-08 DIAGNOSIS — S42031A Displaced fracture of lateral end of right clavicle, initial encounter for closed fracture: Secondary | ICD-10-CM

## 2021-03-08 DIAGNOSIS — S50311A Abrasion of right elbow, initial encounter: Secondary | ICD-10-CM

## 2021-03-08 DIAGNOSIS — S2241XA Multiple fractures of ribs, right side, initial encounter for closed fracture: Secondary | ICD-10-CM

## 2021-03-08 DIAGNOSIS — Y92009 Unspecified place in unspecified non-institutional (private) residence as the place of occurrence of the external cause: Secondary | ICD-10-CM

## 2021-03-08 HISTORY — DX: Unspecified dementia, unspecified severity, without behavioral disturbance, psychotic disturbance, mood disturbance, and anxiety: F03.90

## 2021-03-08 HISTORY — DX: Essential (primary) hypertension: I10

## 2021-03-08 NOTE — Discharge Instructions (Addendum)
Follow-up with her primary care provider for fracture of her ribs and also clavicle.  Ice and elevation as needed for swelling.

## 2021-03-08 NOTE — ED Provider Notes (Signed)
[facility] Emergency Department Provider Note  ____________________________________________   Event Date/Time   First MD Initiated Contact with Patient 03/08/21 0720     (approximate)  I have reviewed the triage vital signs and the nursing notes.   HISTORY  Chief Complaint Fall   HPI Kristina Watkins is a 75 y.o. female is brought to the ED by husband after patient fell at home last evening.  Husband states that she has advanced dementia and forgets that she cannot walk without assistance.  Patient fell from a chair.  Husband states that she has a skin tear to her right elbow.  Patient did hit her head and had a witnessed LOC per husband.  Is confused at her baseline per her husband and is acting her normal self.  He denies any vomiting or complaints of nausea since this event.          Past Medical History:  Diagnosis Date   Dementia (HCC)    Hypertension     Patient Active Problem List   Diagnosis Date Noted   Hyponatremia 11/25/2020   AMS (altered mental status) 11/24/2020   Dementia with behavioral disturbance 11/24/2020   Acute metabolic encephalopathy 11/24/2020   Essential hypertension 11/24/2020    History reviewed. No pertinent surgical history.  Prior to Admission medications   Medication Sig Start Date End Date Taking? Authorizing Provider  amLODipine (NORVASC) 5 MG tablet Take 1 tablet (5 mg total) by mouth daily. 11/28/20   Lurene Shadow, MD    Allergies Donepezil and Penicillins  History reviewed. No pertinent family history.  Social History Social History   Tobacco Use   Smoking status: Unknown  Vaping Use   Vaping Use: Never used  Substance Use Topics   Alcohol use: Never   Drug use: Never    Review of Systems Constitutional: No fever/chills Eyes: No visual changes. ENT: No trauma. Cardiovascular: Denies chest pain. Respiratory: Denies shortness of breath. Gastrointestinal: No abdominal pain.  No nausea, no  vomiting.  No diarrhea.   Genitourinary: Negative for dysuria. Musculoskeletal: Positive right shoulder pain, posterior scalp hematoma. Skin: Skin abrasion right elbow. Neurological: Negative for headaches, focal weakness or numbness. Psychiatric: History advanced dementia.  ____________________________________________   PHYSICAL EXAM:  VITAL SIGNS: ED Triage Vitals  Enc Vitals Group     BP 03/07/21 2144 (!) 153/79     Pulse Rate 03/07/21 2144 81     Resp 03/07/21 2144 16     Temp 03/07/21 2144 97.7 F (36.5 C)     Temp Source 03/07/21 2144 Oral     SpO2 03/07/21 2144 96 %     Weight 03/07/21 2145 117 lb (53.1 kg)     Height 03/07/21 2145 5\' 4"  (1.626 m)     Head Circumference --      Peak Flow --      Pain Score --      Pain Loc --      Pain Edu? --      Excl. in GC? --     Constitutional: Alert and oriented. Well appearing and in no acute distress.  Patient is very pleasant and thanking all staff members frequently.  Patient is cooperative with examination. Eyes: Conjunctivae are normal. PERRL. EOMI. Head: Atraumatic. Nose: No trauma. Mouth/Throat: No trauma. Neck: No stridor.  No tenderness with palpation of the cervical spine posteriorly.  Range of motion is without restriction. Cardiovascular: Normal rate, regular rhythm. Grossly normal heart sounds.  Good peripheral circulation. Respiratory:  Normal respiratory effort.  No retractions. Lungs CTAB. Gastrointestinal: Soft and nontender. No distention.  Bowel sounds normoactive x4 quadrants.  No CVA tenderness. Musculoskeletal: No tenderness on palpation of the thoracic or lumbar spine.  Patient is able to move upper and lower extremities without any difficulty.  No deformity is noted. Neurologic:  Normal speech and language. No gross focal neurologic deficits are appreciated.  Skin:  Skin is warm, dry and intact.  No ecchymosis or discoloration noted.  No rash noted. Psychiatric: Mood and affect are normal. Speech and  behavior are normal.  ____________________________________________   LABS (all labs ordered are listed, but only abnormal results are displayed)  Labs Reviewed - No data to display ____________________________________________  RADIOLOGY I, Tommi Rumps, personally viewed and evaluated these images (plain radiographs) as part of my medical decision making, as well as reviewing the written report by the radiologist.   Official radiology report(s): DG Shoulder Right  Result Date: 03/07/2021 CLINICAL DATA:  Acute pain and deformity after a fall EXAM: RIGHT SHOULDER - 2+ VIEW COMPARISON:  None. FINDINGS: Transverse fracture of the midshaft right clavicle with full shaft width inferior displacement and 3.4 cm inferior overriding of the distal fracture fragment. Acromioclavicular and a coracoclavicular spaces are maintained. Glenohumeral joint appears intact. No evidence of acute fracture or dislocation involving the shoulder joint. Soft tissues are unremarkable. Right lateral rib deformities are incompletely visualized, possibly representing acute or old rib fractures. IMPRESSION: 1. Transverse fracture of the midshaft right clavicle with inferior displacement and over riding. 2. No glenohumeral fracture or dislocation. 3. Nonspecific rib deformities may indicate acute or old fractures. Electronically Signed   By: Burman Nieves M.D.   On: 03/07/2021 22:25   DG Elbow Complete Right  Result Date: 03/07/2021 CLINICAL DATA:  Acute pain and deformity after a fall. EXAM: RIGHT ELBOW - COMPLETE 3+ VIEW COMPARISON:  None. FINDINGS: Diffuse bone demineralization. No evidence of acute fracture or dislocation of the right elbow. No focal bone lesion or bone destruction. Bone cortex appears intact. No significant effusions. IMPRESSION: No acute bony abnormalities. Electronically Signed   By: Burman Nieves M.D.   On: 03/07/2021 22:26   CT HEAD WO CONTRAST ( )  Result Date: 03/07/2021 CLINICAL  DATA:  Larey Seat at home tonight. History of dementia. Head trauma and neck pain. EXAM: CT HEAD WITHOUT CONTRAST CT CERVICAL SPINE WITHOUT CONTRAST TECHNIQUE: Multidetector CT imaging of the head and cervical spine was performed following the standard protocol without intravenous contrast. Multiplanar CT image reconstructions of the cervical spine were also generated. COMPARISON:  Head CT 11/24/2020.  No prior cervical spine CT. FINDINGS: CT HEAD FINDINGS Brain: There is moderate cerebral atrophy with atrophic ventriculomegaly, moderate to severe small vessel disease of the cerebral white matter. Relatively mild cerebellar atrophy. There is no asymmetry concerning for an acute infarct, hemorrhage or mass. There is no midline shift. Vascular: There are scattered calcifications in the carotid siphons but no hyperdense central vessels. Skull: There is mild swelling in the right parietotemporal scalp, lateral left high parietal scalp. There is no depressed skull fracture. Calvarial osteopenia. No lytic lesion. Sinuses/Orbits: Mild membrane thickening is seen in the ethmoids, tiny retention cyst or polyp medially in the right maxillary sinus. Other sinuses are clear as far seen. No mastoid effusion noted. 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 Right globe is unremarkable. S shaped nasal septum with left-sided spurring. Other: None. CT CERVICAL SPINE FINDINGS Factors affecting image quality: Patient motion artifact limits fine  bone detail. Alignment: There is a straightened and slightly reversed cervical lordosis and a slight cervical levoscoliosis. 2-3 mm grade 1 anterolisthesis is seen at C3-4 and C4-5 but is believed degenerative with no traumatic listhesis suspected present. Skull base and vertebrae: Skull base and mastoid air cells are unremarkable. There is osteopenia without appreciable fractures seen through the patient motion artifact. The vertebra are normal in heights. There is  bone-on-bone joint space loss with osteophytes of the anterior atlantodental joint. Soft tissues and spinal canal: No prevertebral fluid or swelling. No visible canal hematoma. There calcifications of the carotid bifurcations , not well seen due to patient motion. Grossly unremarkable thyroid. Disc levels: There is disc space loss at all levels C3-4 through C6-7, greatest at C5-6 and C6-7. Other visualized discs are normal in heights. There are bidirectional osteophytes at C3-4, C5-6 and C6-7, at C5-6 causing moderate spinal canal AP stenosis and mild to moderate spondylotic cord compression, at C3-4 with left paracentral disc osteophyte complex mildly compressing the left hemicord. Other levels do not show significant soft tissue or bony encroachment on the thecal sac. There are facet and uncinate osteophytes at all levels, with foraminal stenosis which is moderate on the right and mild left at C2-3, severe on the left and mild on the right C3-4, bilaterally moderate C4-5, bilaterally moderate to severe C5-6, and bilaterally moderate C6-7. Upper chest: there are biapical pleural-parenchymal scarring type opacities in both lung apices and early centrilobular emphysematous changes. There is an acute transverse oblique fracture of the mid to distal right clavicle shaft with distal fragment under riding and small comminution fragments, nondisplaced fractures of the right medial first rib and right posterior second and third ribs. Other: None. IMPRESSION: 1. Areas of scalp swelling but no acute intracranial CT findings or depressed skull fractures. 2. 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 3. Cervical kyphodextroscoliosis, osteopenia and degenerative changes, without evidence of fractures. 4. C3-4 and C4-5 with grade 1 likely degenerative anterolisthesis. 5. 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 6. COPD and scarring change lung apices. 7. Nondisplaced acute fractures of  the right first through third ribs, and a mid to distal right clavicle shaft fracture with distal fragment under-riding and comminution. Electronically Signed   By: Almira Bar M.D.   On: 03/07/2021 23:09   CT Cervical Spine Wo Contrast  Result Date: 03/07/2021 CLINICAL DATA:  Larey Seat at home tonight. History of dementia. Head trauma and neck pain. EXAM: CT HEAD WITHOUT CONTRAST CT CERVICAL SPINE WITHOUT CONTRAST TECHNIQUE: Multidetector CT imaging of the head and cervical spine was performed following the standard protocol without intravenous contrast. Multiplanar CT image reconstructions of the cervical spine were also generated. COMPARISON:  Head CT 11/24/2020.  No prior cervical spine CT. FINDINGS: CT HEAD FINDINGS Brain: There is moderate cerebral atrophy with atrophic ventriculomegaly, moderate to severe small vessel disease of the cerebral white matter. Relatively mild cerebellar atrophy. There is no asymmetry concerning for an acute infarct, hemorrhage or mass. There is no midline shift. Vascular: There are scattered calcifications in the carotid siphons but no hyperdense central vessels. Skull: There is mild swelling in the right parietotemporal scalp, lateral left high parietal scalp. There is no depressed skull fracture. Calvarial osteopenia. No lytic lesion. Sinuses/Orbits: Mild membrane thickening is seen in the ethmoids, tiny retention cyst or polyp medially in the right maxillary sinus. Other sinuses are clear as far seen. No mastoid effusion noted. 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  Right globe is unremarkable. S shaped nasal septum with left-sided spurring. Other: None. CT CERVICAL SPINE FINDINGS Factors affecting image quality: Patient motion artifact limits fine bone detail. Alignment: There is a straightened and slightly reversed cervical lordosis and a slight cervical levoscoliosis. 2-3 mm grade 1 anterolisthesis is seen at C3-4 and C4-5 but is believed  degenerative with no traumatic listhesis suspected present. Skull base and vertebrae: Skull base and mastoid air cells are unremarkable. There is osteopenia without appreciable fractures seen through the patient motion artifact. The vertebra are normal in heights. There is bone-on-bone joint space loss with osteophytes of the anterior atlantodental joint. Soft tissues and spinal canal: No prevertebral fluid or swelling. No visible canal hematoma. There calcifications of the carotid bifurcations , not well seen due to patient motion. Grossly unremarkable thyroid. Disc levels: There is disc space loss at all levels C3-4 through C6-7, greatest at C5-6 and C6-7. Other visualized discs are normal in heights. There are bidirectional osteophytes at C3-4, C5-6 and C6-7, at C5-6 causing moderate spinal canal AP stenosis and mild to moderate spondylotic cord compression, at C3-4 with left paracentral disc osteophyte complex mildly compressing the left hemicord. Other levels do not show significant soft tissue or bony encroachment on the thecal sac. There are facet and uncinate osteophytes at all levels, with foraminal stenosis which is moderate on the right and mild left at C2-3, severe on the left and mild on the right C3-4, bilaterally moderate C4-5, bilaterally moderate to severe C5-6, and bilaterally moderate C6-7. Upper chest: there are biapical pleural-parenchymal scarring type opacities in both lung apices and early centrilobular emphysematous changes. There is an acute transverse oblique fracture of the mid to distal right clavicle shaft with distal fragment under riding and small comminution fragments, nondisplaced fractures of the right medial first rib and right posterior second and third ribs. Other: None. IMPRESSION: 1. Areas of scalp swelling but no acute intracranial CT findings or depressed skull fractures. 2. 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 3. Cervical kyphodextroscoliosis, osteopenia  and degenerative changes, without evidence of fractures. 4. C3-4 and C4-5 with grade 1 likely degenerative anterolisthesis. 5. 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 6. COPD and scarring change lung apices. 7. Nondisplaced acute fractures of the right first through third ribs, and a mid to distal right clavicle shaft fracture with distal fragment under-riding and comminution. Electronically Signed   By: Almira Bar M.D.   On: 03/07/2021 23:09    ____________________________________________   PROCEDURES  Procedure(s) performed (including Critical Care):  Procedures   ____________________________________________   INITIAL IMPRESSION / ASSESSMENT AND PLAN / ED COURSE  As part of my medical decision making, I reviewed the following data within the electronic MEDICAL RECORD NUMBER Notes from prior ED visits and [place] Controlled Substance Database  75 year old female presents to the ED by way of husband as patient has advanced dementia.  He states that she forgets that she cannot walk without assistance and frequently falls.  Last evening she had a witnessed fall in which there was several seconds of loss of consciousness.  Patient has not had any nausea or vomiting since this occurred.  CT scan of head were negative for any acute intracranial findings findings.  CT of cervical spine did show nondisplaced fractures on the right ribs 1 through 3.  There was also a distal right clavicular fracture with minimal displacement.  Husband was made aware.  Patient did not appear to be in any distress and  was remained very pleasant during her ED stay.  I discussed with husband that for comfort we could put her in a sling due to her fractured clavicle however he states that she is not leave anything on and removes them immediately.  He states that when she broke her wrist she removed multiple types of immobilizers.  He is encouraged to follow-up with her PCP if any continued problems.   He will return to the emergency department if any sudden changes in her demeanor or urgent concerns.  ____________________________________________   FINAL CLINICAL IMPRESSION(S) / ED DIAGNOSES  Final diagnoses:  Traumatic closed fracture of distal clavicle with minimal displacement, right, initial encounter  Abrasion of skin of right elbow  Closed fracture of multiple ribs of right side, initial encounter  Fall in home, initial encounter     ED Discharge Orders     None        Note:  This document was prepared using Dragon voice recognition software and may include unintentional dictation errors.    Tommi Rumps, PA-C 03/08/21 1502    Gilles Chiquito, MD 03/08/21 [phone]

## 2021-10-10 DEATH — deceased

## 2023-03-14 IMAGING — CT CT CERVICAL SPINE W/O CM
3 of 6 series · 10 of 33 positions shown, 11 images · non-contrast
Comparison: Head CT 11/24/2020.  No prior cervical spine CT.

CLINICAL DATA: Fell at home tonight. History of dementia. Head
trauma and neck pain.

EXAM:
CT HEAD WITHOUT CONTRAST
CT CERVICAL SPINE WITHOUT CONTRAST
TECHNIQUE: Multidetector CT imaging of the head and cervical spine was
performed following the standard protocol without intravenous
contrast. Multiplanar CT image reconstructions of the cervical spine
were also generated.

[Series 7: coronal bone · coronal · 0.41mm/px · 3 of 72 slices shown]
[im 21/72  bone]
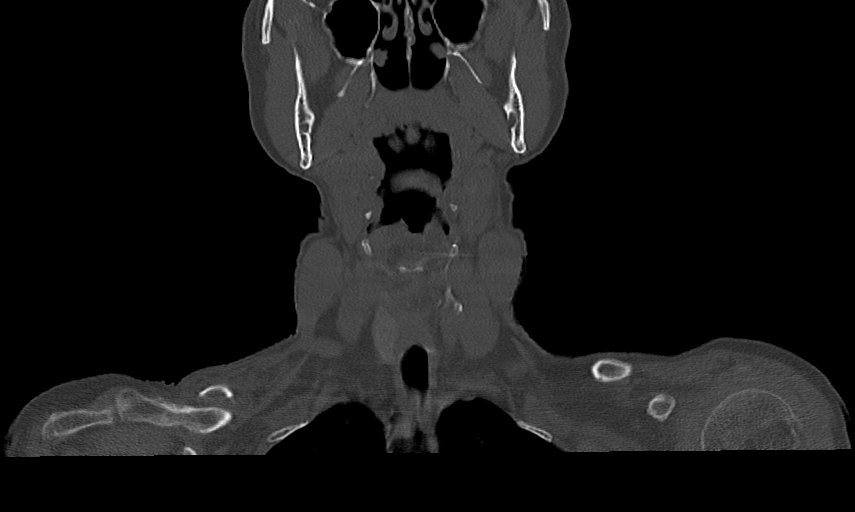
[im 31/72  bone]
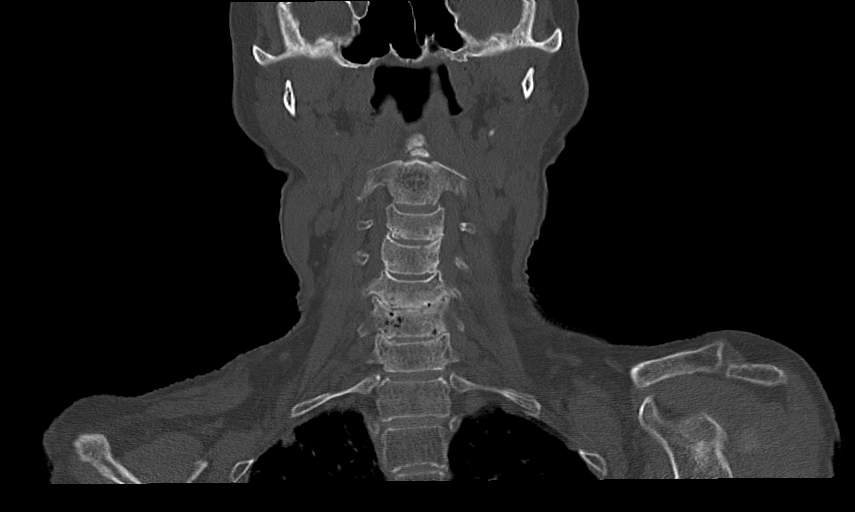
[im 41/72  bone]
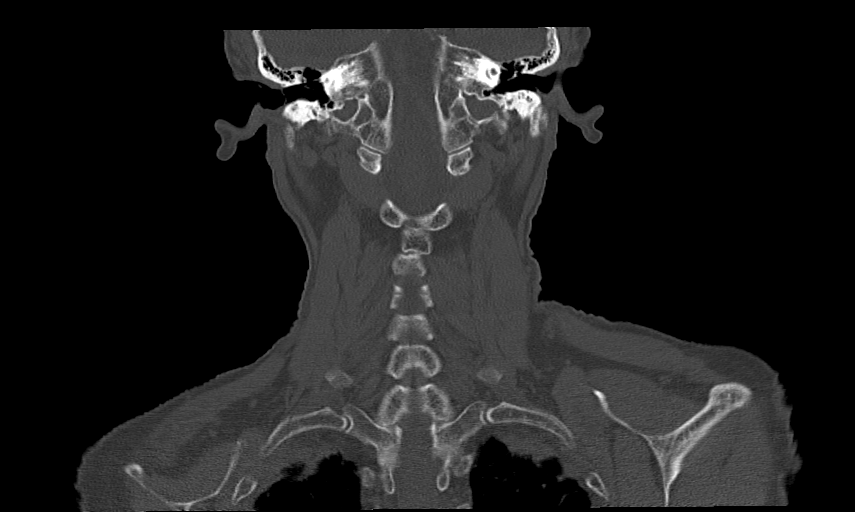

[Series 8: orthogonal bone · axial · 0.30mm/px · z∈[-266,-193]mm · 2 of 105 slices shown, 3 images]
[im 42/105  soft-tissue]
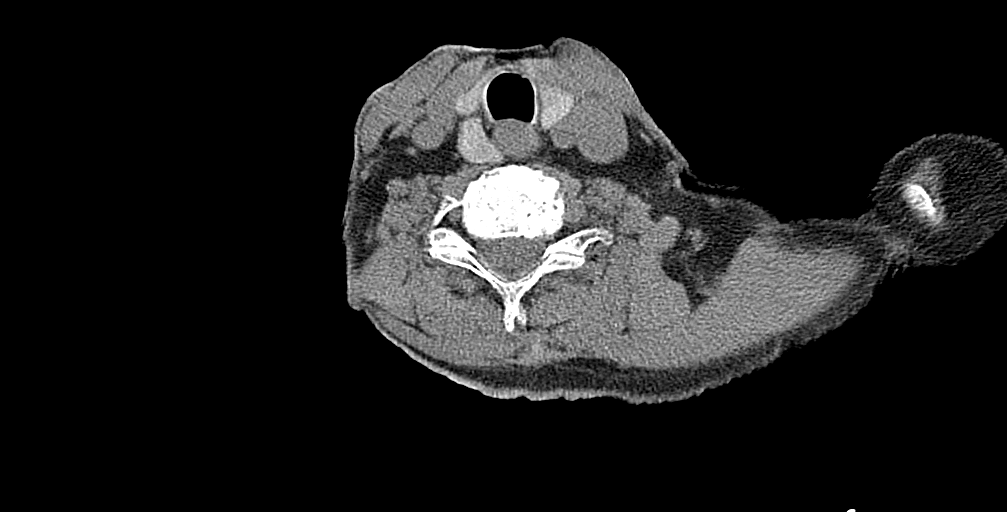
[im 42/105  bone]
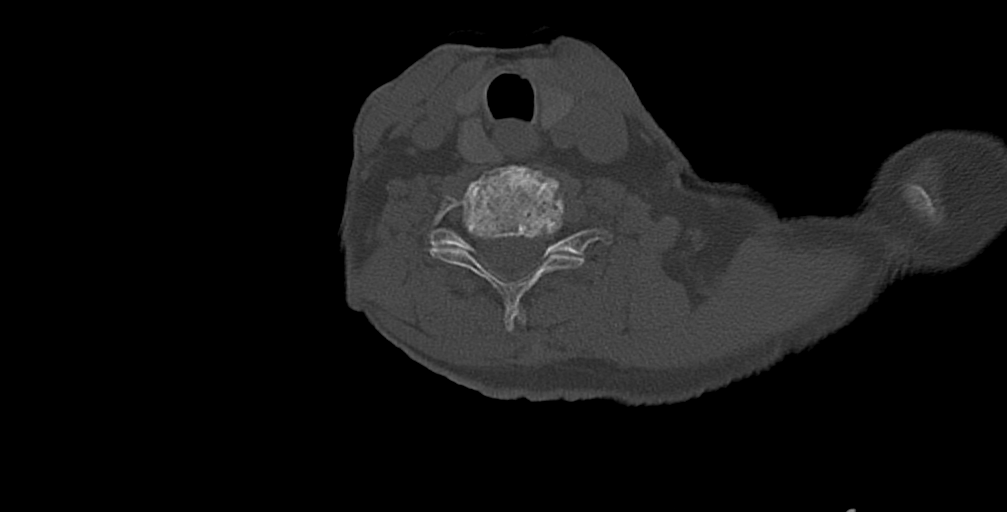
[im 84/105  bone]
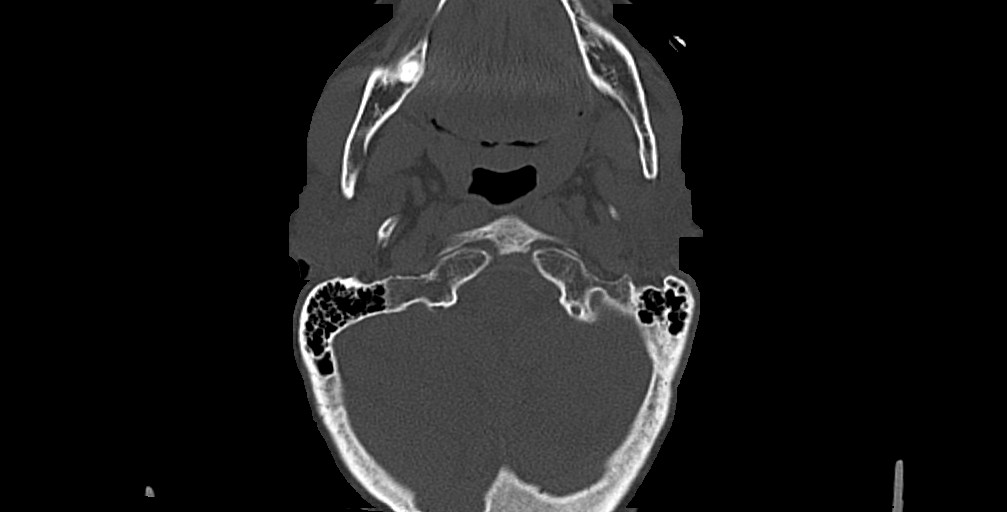

[Series 13: sagittal bone · sagittal · 0.24mm/px · 5 of 160 slices shown]
[im 27/160  bone]
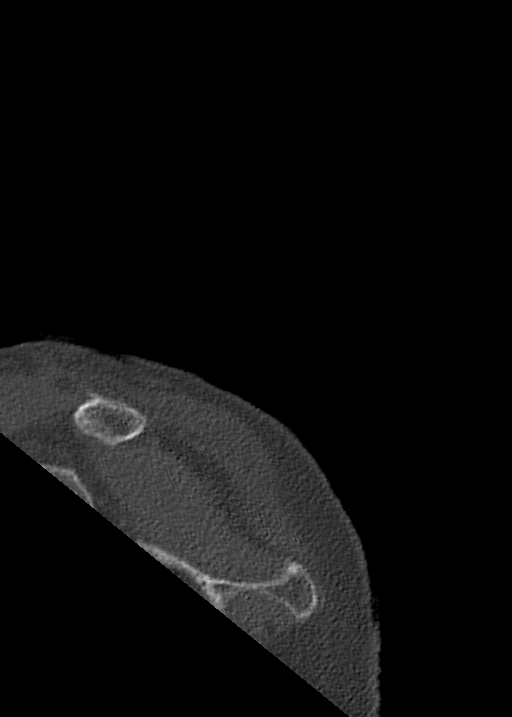
[im 54/160  bone]
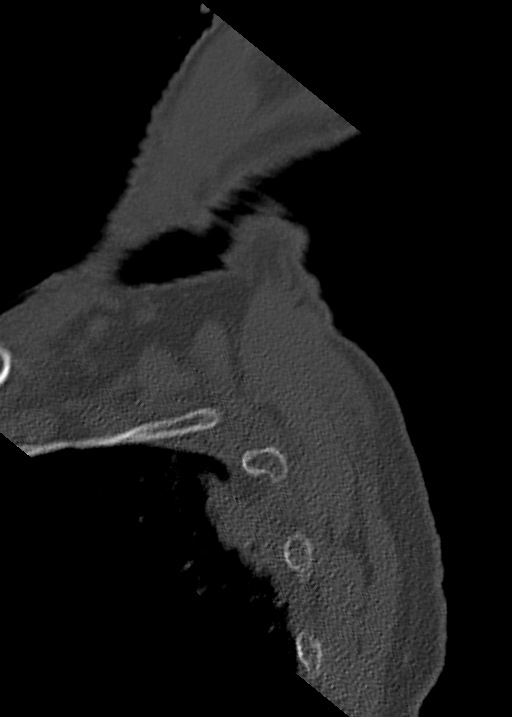
[im 80/160  bone]
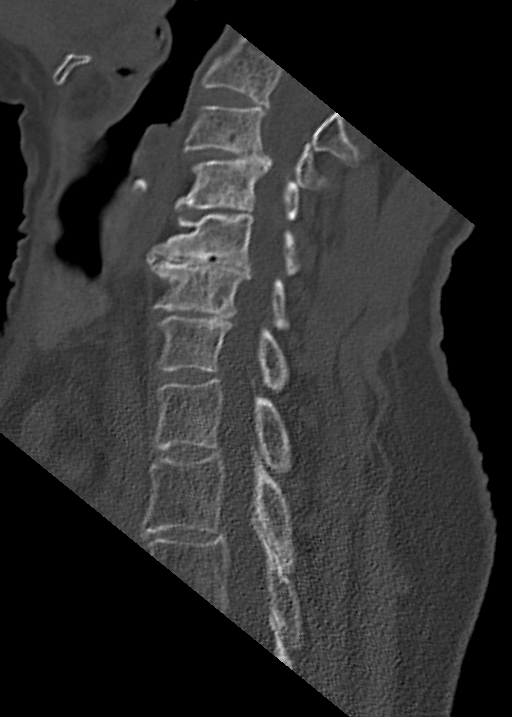
[im 107/160  bone]
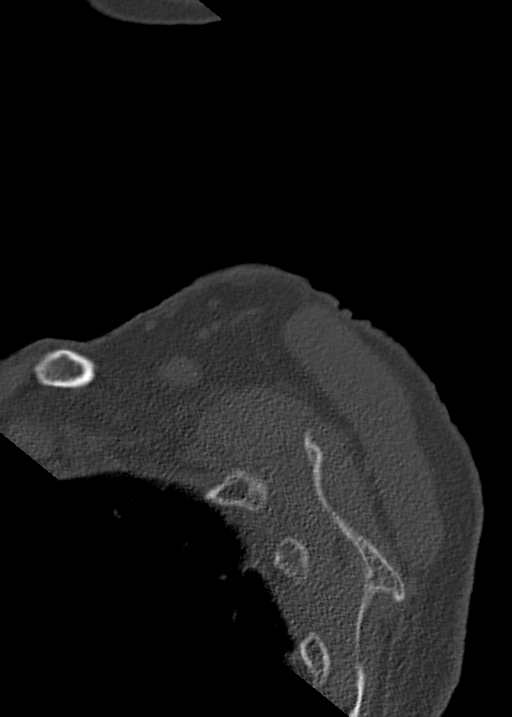
[im 133/160  bone]
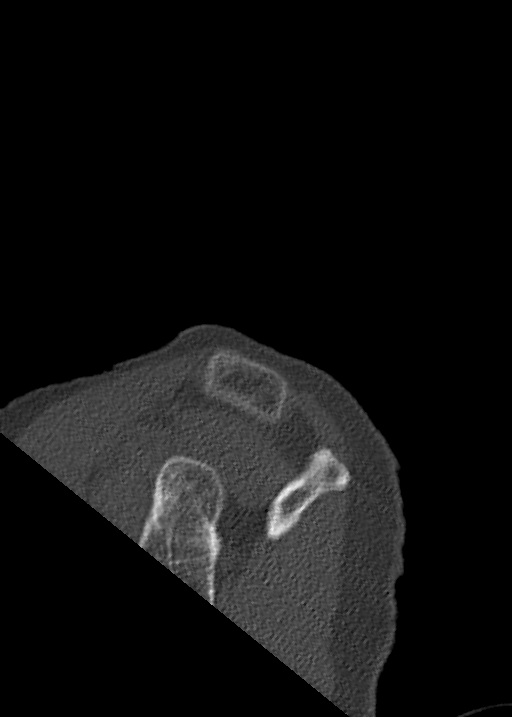

[10 of 33 positions shown; findings below may reference images not displayed]

FINDINGS: CT HEAD FINDINGS

Brain: There is moderate cerebral atrophy with atrophic
ventriculomegaly, moderate to severe small vessel disease of the
cerebral white matter. Relatively mild cerebellar atrophy. There is
no asymmetry concerning for an acute infarct, hemorrhage or mass.
There is no midline shift.

Vascular: There are scattered calcifications in the carotid siphons
but no hyperdense central vessels.

Skull: There is mild swelling in the right parietotemporal scalp,
lateral left high parietal scalp. There is no depressed skull
fracture. Calvarial osteopenia. No lytic lesion.

Sinuses/Orbits: Mild membrane thickening is seen in the ethmoids,
tiny retention cyst or polyp medially in the right maxillary sinus.
Other sinuses are clear as far seen. No mastoid effusion noted. Left
eye demonstrates a temporal sided posterior uveoscleral staphyloma.
Right globe is unremarkable. S shaped nasal septum with left-sided
spurring.

Other: None.

CT CERVICAL SPINE FINDINGS

Factors affecting image quality: Patient motion artifact limits fine
bone detail.

Alignment: There is a straightened and slightly reversed cervical
lordosis and a slight cervical levoscoliosis. 2-3 mm grade 1
anterolisthesis is seen at C3-4 and C4-5 but is believed
degenerative with no traumatic listhesis suspected present.

Skull base and vertebrae: Skull base and mastoid air cells are
unremarkable. There is osteopenia without appreciable fractures seen
through the patient motion artifact. The vertebra are normal in
heights. There is bone-on-bone joint space loss with osteophytes of
the anterior atlantodental joint.

Soft tissues and spinal canal: No prevertebral fluid or swelling. No
visible canal hematoma. There calcifications of the carotid
bifurcations , not well seen due to patient motion. Grossly
unremarkable thyroid.

Disc levels: There is disc space loss at all levels C3-4 through
C6-7, greatest at C5-6 and C6-7. Other visualized discs are normal
in heights. There are bidirectional osteophytes at C3-4, C5-6 and
C6-7, at C5-6 causing moderate spinal canal AP stenosis and mild to
moderate spondylotic cord compression, at C3-4 with left paracentral
disc osteophyte complex mildly compressing the left hemicord.

Other levels do not show significant soft tissue or bony
encroachment on the thecal sac. There are facet and uncinate
osteophytes at all levels, with foraminal stenosis which is moderate
on the right and mild left at C2-3, severe on the left and mild on
the right C3-4, bilaterally moderate C4-5, bilaterally moderate to
severe C5-6, and bilaterally moderate C6-7.

Upper chest: there are biapical pleural-parenchymal scarring type
opacities in both lung apices and early centrilobular emphysematous
changes. There is an acute transverse oblique fracture of the mid to
distal right clavicle shaft with distal fragment under riding and
small comminution fragments, nondisplaced fractures of the right
medial first rib and right posterior second and third ribs.

Other: None.
IMPRESSION: 1. Areas of scalp swelling but no acute intracranial CT findings or
depressed skull fractures.
2. Atrophy with moderate to severe small vessel disease.
3. Cervical kyphodextroscoliosis, osteopenia and degenerative
changes, without evidence of fractures.
4. C3-4 and C4-5 with grade 1 likely degenerative anterolisthesis.
5. Spondylotic cord compression 2 levels with multilevel foraminal
stenosis.
6. COPD and scarring change lung apices.
7. Nondisplaced acute fractures of the right first through third
ribs, and a mid to distal right clavicle shaft fracture with distal
fragment under-riding and comminution.

## 2023-03-14 IMAGING — CT CT HEAD W/O CM
3 of 4 series · 13 of 47 positions shown, 15 images · non-contrast
Comparison: Head CT 11/24/2020.  No prior cervical spine CT.

CLINICAL DATA: Fell at home tonight. History of dementia. Head
trauma and neck pain.

EXAM:
CT HEAD WITHOUT CONTRAST
CT CERVICAL SPINE WITHOUT CONTRAST
TECHNIQUE: Multidetector CT imaging of the head and cervical spine was
performed following the standard protocol without intravenous
contrast. Multiplanar CT image reconstructions of the cervical spine
were also generated.

[Series 2: head wo · axial · 0.45mm/px · z∈[-124,-4]mm · 7 of 33 slices shown, 9 images]
[im 5/33  brain]
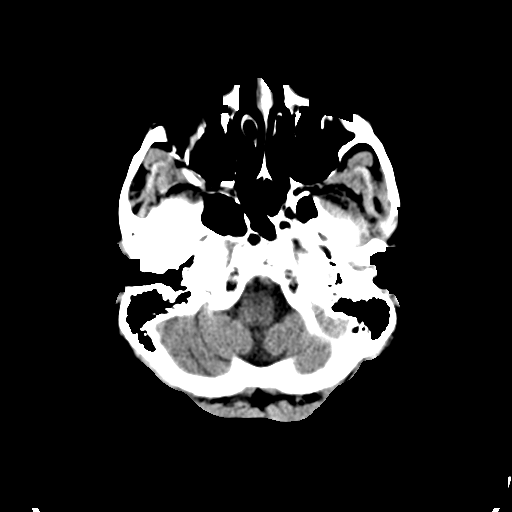
[im 5/33  bone]
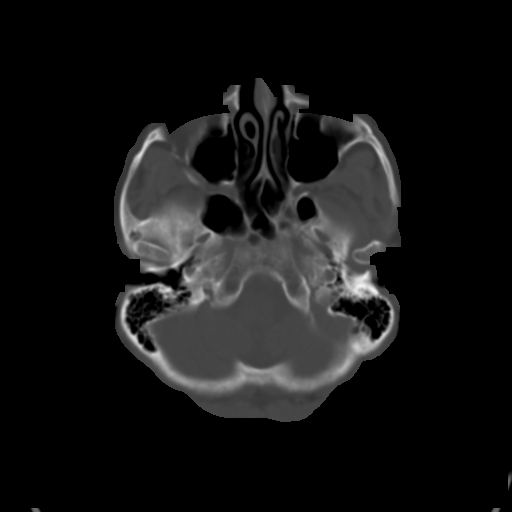
[im 9/33  brain]
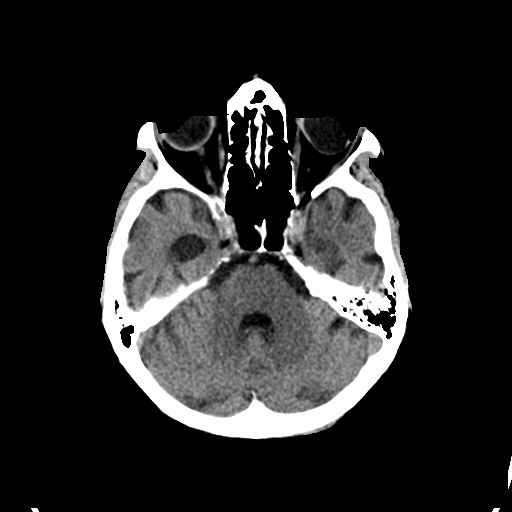
[im 13/33  brain]
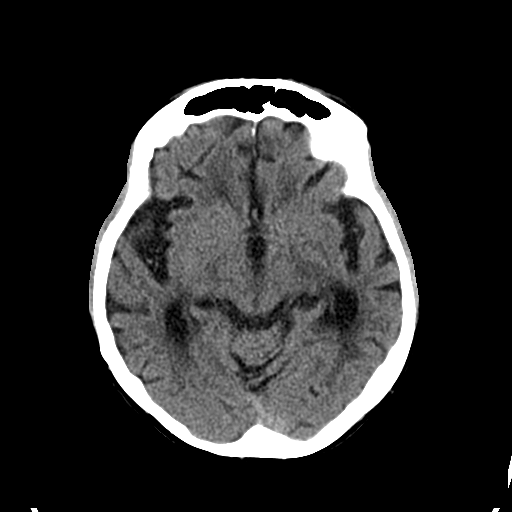
[im 17/33  brain]
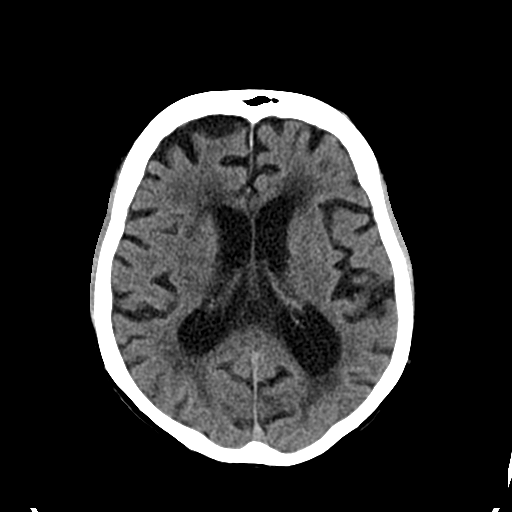
[im 21/33  brain]
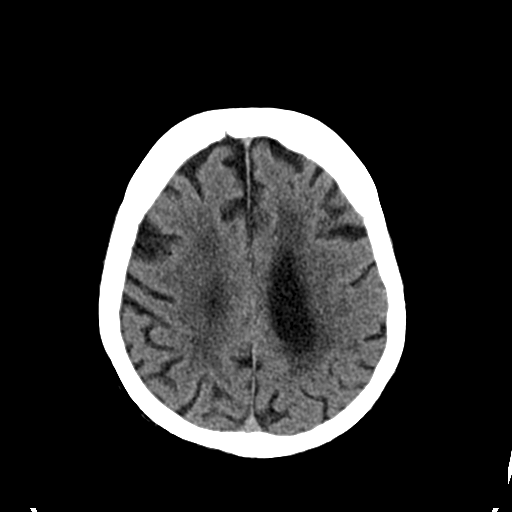
[im 21/33  bone]
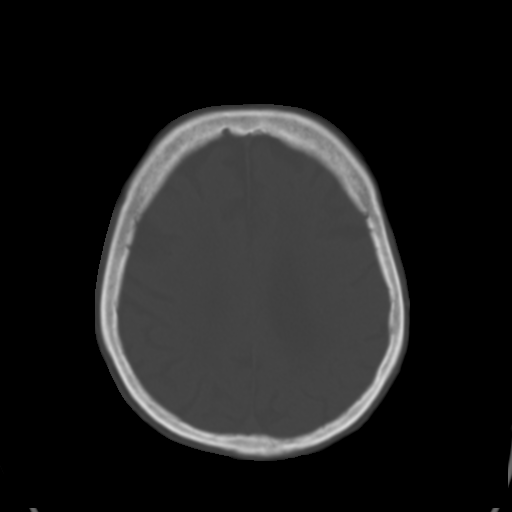
[im 25/33  brain]
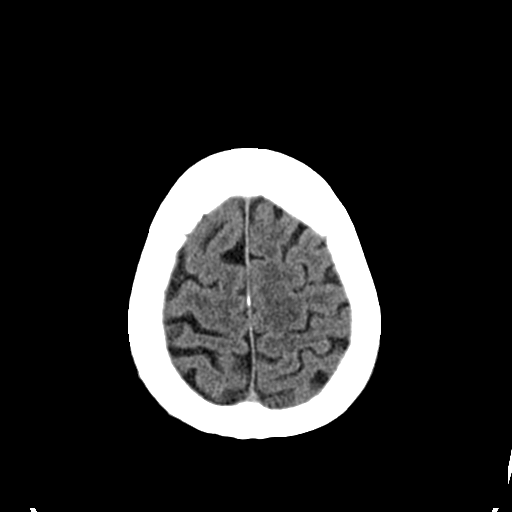
[im 29/33  brain]
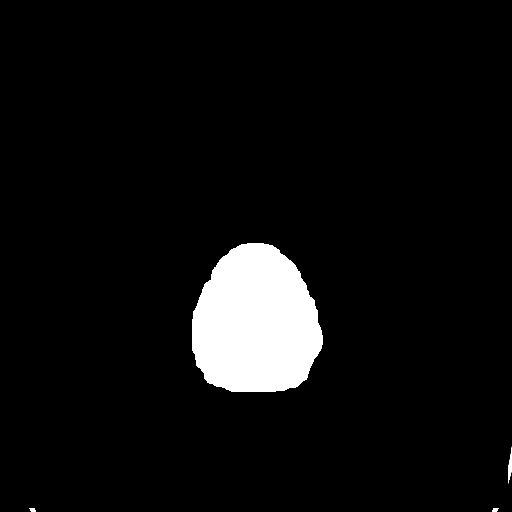

[Series 4: coronal soft tissue · coronal · 0.32mm/px · 3 of 59 slices shown]
[im 20/59  brain]
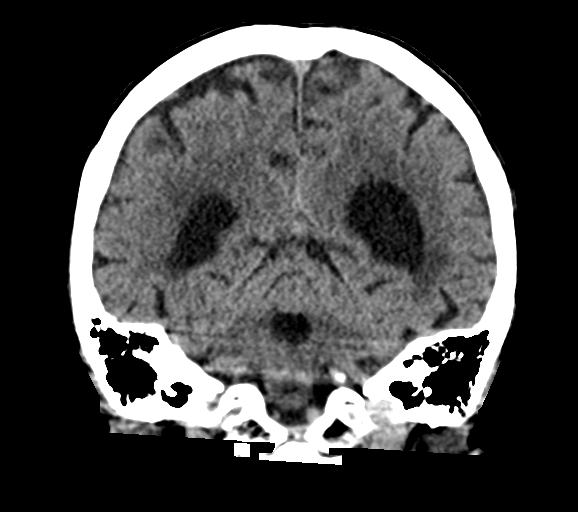
[im 26/59  brain]
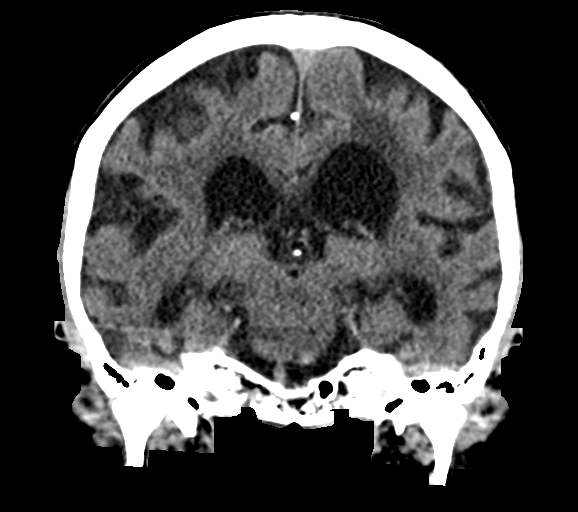
[im 33/59  brain]
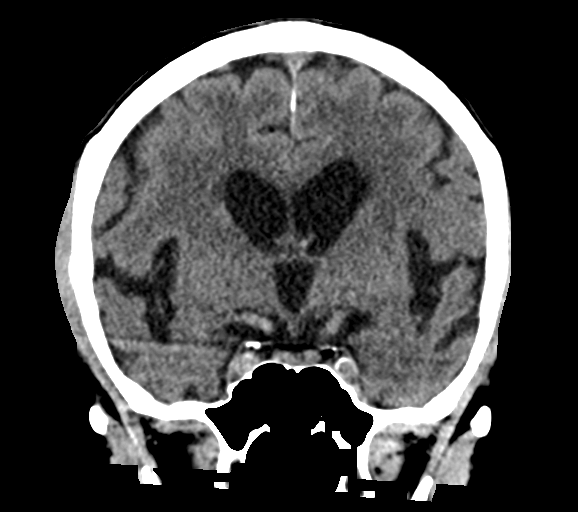

[Series 5: sagittal soft tissue · sagittal · 0.33mm/px · 3 of 54 slices shown]
[im 18/54  brain]
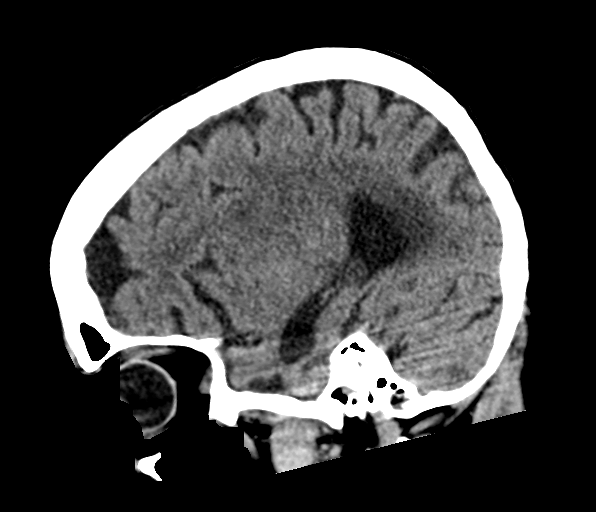
[im 27/54  brain]
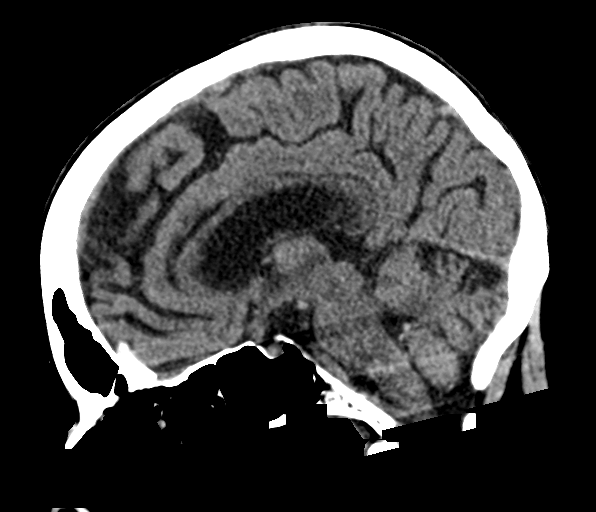
[im 36/54  brain]
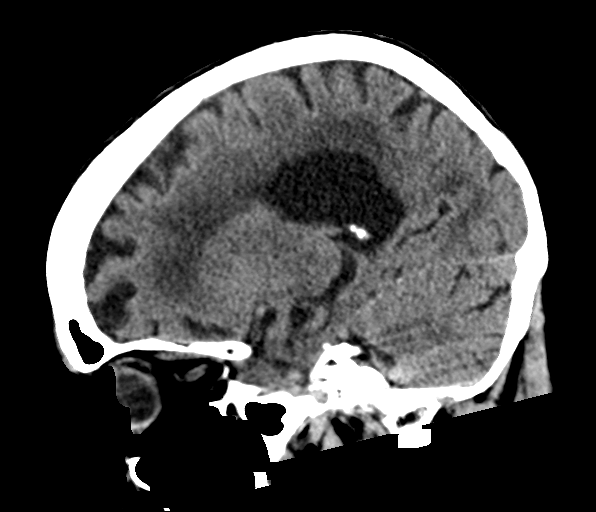

[13 of 47 positions shown; findings below may reference images not displayed]

FINDINGS: CT HEAD FINDINGS

Brain: There is moderate cerebral atrophy with atrophic
ventriculomegaly, moderate to severe small vessel disease of the
cerebral white matter. Relatively mild cerebellar atrophy. There is
no asymmetry concerning for an acute infarct, hemorrhage or mass.
There is no midline shift.

Vascular: There are scattered calcifications in the carotid siphons
but no hyperdense central vessels.

Skull: There is mild swelling in the right parietotemporal scalp,
lateral left high parietal scalp. There is no depressed skull
fracture. Calvarial osteopenia. No lytic lesion.

Sinuses/Orbits: Mild membrane thickening is seen in the ethmoids,
tiny retention cyst or polyp medially in the right maxillary sinus.
Other sinuses are clear as far seen. No mastoid effusion noted. Left
eye demonstrates a temporal sided posterior uveoscleral staphyloma.
Right globe is unremarkable. S shaped nasal septum with left-sided
spurring.

Other: None.

CT CERVICAL SPINE FINDINGS

Factors affecting image quality: Patient motion artifact limits fine
bone detail.

Alignment: There is a straightened and slightly reversed cervical
lordosis and a slight cervical levoscoliosis. 2-3 mm grade 1
anterolisthesis is seen at C3-4 and C4-5 but is believed
degenerative with no traumatic listhesis suspected present.

Skull base and vertebrae: Skull base and mastoid air cells are
unremarkable. There is osteopenia without appreciable fractures seen
through the patient motion artifact. The vertebra are normal in
heights. There is bone-on-bone joint space loss with osteophytes of
the anterior atlantodental joint.

Soft tissues and spinal canal: No prevertebral fluid or swelling. No
visible canal hematoma. There calcifications of the carotid
bifurcations , not well seen due to patient motion. Grossly
unremarkable thyroid.

Disc levels: There is disc space loss at all levels C3-4 through
C6-7, greatest at C5-6 and C6-7. Other visualized discs are normal
in heights. There are bidirectional osteophytes at C3-4, C5-6 and
C6-7, at C5-6 causing moderate spinal canal AP stenosis and mild to
moderate spondylotic cord compression, at C3-4 with left paracentral
disc osteophyte complex mildly compressing the left hemicord.

Other levels do not show significant soft tissue or bony
encroachment on the thecal sac. There are facet and uncinate
osteophytes at all levels, with foraminal stenosis which is moderate
on the right and mild left at C2-3, severe on the left and mild on
the right C3-4, bilaterally moderate C4-5, bilaterally moderate to
severe C5-6, and bilaterally moderate C6-7.

Upper chest: there are biapical pleural-parenchymal scarring type
opacities in both lung apices and early centrilobular emphysematous
changes. There is an acute transverse oblique fracture of the mid to
distal right clavicle shaft with distal fragment under riding and
small comminution fragments, nondisplaced fractures of the right
medial first rib and right posterior second and third ribs.

Other: None.
IMPRESSION: 1. Areas of scalp swelling but no acute intracranial CT findings or
depressed skull fractures.
2. Atrophy with moderate to severe small vessel disease.
3. Cervical kyphodextroscoliosis, osteopenia and degenerative
changes, without evidence of fractures.
4. C3-4 and C4-5 with grade 1 likely degenerative anterolisthesis.
5. Spondylotic cord compression 2 levels with multilevel foraminal
stenosis.
6. COPD and scarring change lung apices.
7. Nondisplaced acute fractures of the right first through third
ribs, and a mid to distal right clavicle shaft fracture with distal
fragment under-riding and comminution.
